# Patient Record
Sex: Female | Born: 1955 | Race: White | Hispanic: No | Marital: Married | Smoking: Former smoker
Health system: Southern US, Community
[De-identification: ages and names within clinical notes are randomized; demographics above are authoritative.]

## PROBLEM LIST (undated history)

## (undated) DIAGNOSIS — D869 Sarcoidosis, unspecified: Secondary | ICD-10-CM

## (undated) DIAGNOSIS — C4491 Basal cell carcinoma of skin, unspecified: Secondary | ICD-10-CM

## (undated) DIAGNOSIS — E079 Disorder of thyroid, unspecified: Secondary | ICD-10-CM

## (undated) DIAGNOSIS — K8020 Calculus of gallbladder without cholecystitis without obstruction: Secondary | ICD-10-CM

## (undated) DIAGNOSIS — R768 Other specified abnormal immunological findings in serum: Secondary | ICD-10-CM

## (undated) DIAGNOSIS — E785 Hyperlipidemia, unspecified: Secondary | ICD-10-CM

## (undated) DIAGNOSIS — I10 Essential (primary) hypertension: Secondary | ICD-10-CM

## (undated) DIAGNOSIS — B009 Herpesviral infection, unspecified: Secondary | ICD-10-CM

## (undated) DIAGNOSIS — F419 Anxiety disorder, unspecified: Secondary | ICD-10-CM

## (undated) DIAGNOSIS — N766 Ulceration of vulva: Secondary | ICD-10-CM

## (undated) DIAGNOSIS — F32A Depression, unspecified: Secondary | ICD-10-CM

## (undated) DIAGNOSIS — K219 Gastro-esophageal reflux disease without esophagitis: Secondary | ICD-10-CM

## (undated) DIAGNOSIS — S83206A Unspecified tear of unspecified meniscus, current injury, right knee, initial encounter: Secondary | ICD-10-CM

## (undated) DIAGNOSIS — Z780 Asymptomatic menopausal state: Secondary | ICD-10-CM

## (undated) DIAGNOSIS — D472 Monoclonal gammopathy: Secondary | ICD-10-CM

## (undated) DIAGNOSIS — M1990 Unspecified osteoarthritis, unspecified site: Secondary | ICD-10-CM

## (undated) DIAGNOSIS — F329 Major depressive disorder, single episode, unspecified: Secondary | ICD-10-CM

## (undated) HISTORY — DX: Gastro-esophageal reflux disease without esophagitis: K21.9

## (undated) HISTORY — DX: Asymptomatic menopausal state: Z78.0

## (undated) HISTORY — DX: Herpesviral infection, unspecified: B00.9

## (undated) HISTORY — DX: Unspecified osteoarthritis, unspecified site: M19.90

## (undated) HISTORY — DX: Ulceration of vulva: N76.6

## (undated) HISTORY — DX: Monoclonal gammopathy: D47.2

## (undated) HISTORY — DX: Essential (primary) hypertension: I10

## (undated) HISTORY — DX: Other specified abnormal immunological findings in serum: R76.8

## (undated) HISTORY — DX: Anxiety disorder, unspecified: F41.9

## (undated) HISTORY — DX: Depression, unspecified: F32.A

## (undated) HISTORY — DX: Disorder of thyroid, unspecified: E07.9

## (undated) HISTORY — DX: Hypercalcemia: E83.52

## (undated) HISTORY — DX: Unspecified tear of unspecified meniscus, current injury, right knee, initial encounter: S83.206A

## (undated) HISTORY — DX: Basal cell carcinoma of skin, unspecified: C44.91

## (undated) HISTORY — DX: Hyperlipidemia, unspecified: E78.5

## (undated) HISTORY — DX: Sarcoidosis, unspecified: D86.9

## (undated) HISTORY — DX: Major depressive disorder, single episode, unspecified: F32.9

## (undated) HISTORY — PX: BREAST EXCISIONAL BIOPSY: SUR124

## (undated) HISTORY — DX: Calculus of gallbladder without cholecystitis without obstruction: K80.20

---

## 2003-09-23 HISTORY — PX: BILATERAL SALPINGOOPHORECTOMY: SHX1223

## 2003-09-23 HISTORY — PX: ABDOMINAL HYSTERECTOMY: SHX81

## 2007-10-28 ENCOUNTER — Encounter: Payer: Self-pay | Admitting: Critical Care Medicine

## 2008-10-27 ENCOUNTER — Encounter: Payer: Self-pay | Admitting: Critical Care Medicine

## 2008-12-11 ENCOUNTER — Encounter: Payer: Self-pay | Admitting: Critical Care Medicine

## 2008-12-19 ENCOUNTER — Encounter: Payer: Self-pay | Admitting: Critical Care Medicine

## 2008-12-20 ENCOUNTER — Encounter: Payer: Self-pay | Admitting: Critical Care Medicine

## 2008-12-29 ENCOUNTER — Encounter: Payer: Self-pay | Admitting: Internal Medicine

## 2009-01-01 ENCOUNTER — Encounter: Payer: Self-pay | Admitting: Critical Care Medicine

## 2009-01-02 ENCOUNTER — Encounter: Payer: Self-pay | Admitting: Critical Care Medicine

## 2009-02-20 ENCOUNTER — Encounter: Payer: Self-pay | Admitting: Critical Care Medicine

## 2009-03-05 ENCOUNTER — Encounter: Payer: Self-pay | Admitting: Critical Care Medicine

## 2009-03-20 ENCOUNTER — Encounter: Payer: Self-pay | Admitting: Critical Care Medicine

## 2009-03-21 ENCOUNTER — Encounter: Payer: Self-pay | Admitting: Critical Care Medicine

## 2009-03-22 ENCOUNTER — Ambulatory Visit: Payer: Self-pay | Admitting: Critical Care Medicine

## 2009-03-22 DIAGNOSIS — I10 Essential (primary) hypertension: Secondary | ICD-10-CM

## 2009-03-22 DIAGNOSIS — D869 Sarcoidosis, unspecified: Secondary | ICD-10-CM

## 2009-04-13 ENCOUNTER — Telehealth: Payer: Self-pay | Admitting: Pulmonary Disease

## 2009-04-16 ENCOUNTER — Telehealth (INDEPENDENT_AMBULATORY_CARE_PROVIDER_SITE_OTHER): Payer: Self-pay | Admitting: *Deleted

## 2009-04-19 ENCOUNTER — Ambulatory Visit: Payer: Self-pay | Admitting: Diagnostic Radiology

## 2009-04-19 ENCOUNTER — Ambulatory Visit: Payer: Self-pay | Admitting: Critical Care Medicine

## 2009-04-19 ENCOUNTER — Ambulatory Visit (HOSPITAL_BASED_OUTPATIENT_CLINIC_OR_DEPARTMENT_OTHER): Admission: RE | Admit: 2009-04-19 | Discharge: 2009-04-19 | Payer: Self-pay | Admitting: Critical Care Medicine

## 2009-04-19 DIAGNOSIS — E2749 Other adrenocortical insufficiency: Secondary | ICD-10-CM | POA: Insufficient documentation

## 2009-04-25 LAB — CONVERTED CEMR LAB: Pap Smear: NORMAL

## 2009-05-10 ENCOUNTER — Ambulatory Visit: Payer: Self-pay | Admitting: Critical Care Medicine

## 2009-05-16 ENCOUNTER — Ambulatory Visit: Payer: Self-pay | Admitting: Internal Medicine

## 2009-05-16 DIAGNOSIS — R7309 Other abnormal glucose: Secondary | ICD-10-CM | POA: Insufficient documentation

## 2009-05-16 LAB — CONVERTED CEMR LAB
ALT: 42 units/L — ABNORMAL HIGH (ref 0–35)
Albumin: 4.6 g/dL (ref 3.5–5.2)
BUN: 19 mg/dL (ref 6–23)
Bilirubin, Direct: 0.1 mg/dL (ref 0.0–0.3)
CO2: 23 meq/L (ref 19–32)
Creatinine, Ser: 1.01 mg/dL (ref 0.40–1.20)
Eosinophils Absolute: 0.5 10*3/uL (ref 0.0–0.7)
HCT: 37.7 % (ref 36.0–46.0)
Hemoglobin: 13.4 g/dL (ref 12.0–15.0)
Hgb A1c MFr Bld: 5.7 % (ref 4.6–6.1)
Indirect Bilirubin: 0.6 mg/dL (ref 0.0–0.9)
Lymphs Abs: 1.5 10*3/uL (ref 0.7–4.0)
MCV: 87.7 fL (ref 78.0–100.0)
Monocytes Absolute: 1.1 10*3/uL — ABNORMAL HIGH (ref 0.1–1.0)
Monocytes Relative: 15 % — ABNORMAL HIGH (ref 3–12)
Neutro Abs: 4.5 10*3/uL (ref 1.7–7.7)
Potassium: 4.3 meq/L (ref 3.5–5.3)
RBC: 4.3 M/uL (ref 3.87–5.11)
Sodium: 139 meq/L (ref 135–145)
Total CHOL/HDL Ratio: 5.3
Total Protein: 6.9 g/dL (ref 6.0–8.3)
Triglycerides: 154 mg/dL — ABNORMAL HIGH (ref <150)
WBC: 7.7 10*3/uL (ref 4.0–10.5)

## 2009-05-22 ENCOUNTER — Encounter: Payer: Self-pay | Admitting: Internal Medicine

## 2009-08-09 ENCOUNTER — Ambulatory Visit: Payer: Self-pay | Admitting: Critical Care Medicine

## 2009-08-13 LAB — CONVERTED CEMR LAB: Angiotensin 1 Converting Enzyme: 75 units/L — ABNORMAL HIGH (ref 9–67)

## 2009-08-14 ENCOUNTER — Ambulatory Visit: Payer: Self-pay | Admitting: Internal Medicine

## 2009-08-14 DIAGNOSIS — R002 Palpitations: Secondary | ICD-10-CM | POA: Insufficient documentation

## 2009-09-05 ENCOUNTER — Encounter: Payer: Self-pay | Admitting: Internal Medicine

## 2009-10-19 ENCOUNTER — Ambulatory Visit: Payer: Self-pay | Admitting: Internal Medicine

## 2009-10-19 LAB — CONVERTED CEMR LAB
AST: 23 units/L (ref 0–37)
BUN: 18 mg/dL (ref 6–23)
Free T4: 1.19 ng/dL (ref 0.80–1.80)
Glucose, Bld: 97 mg/dL (ref 70–99)
HDL: 53 mg/dL (ref >39)
Potassium: 4.2 meq/L (ref 3.5–5.3)
TSH: 1.677 microintl units/mL (ref 0.350–4.500)
Total Bilirubin: 0.5 mg/dL (ref 0.3–1.2)
Total CHOL/HDL Ratio: 4.4
Triglycerides: 191 mg/dL — ABNORMAL HIGH (ref <150)

## 2009-10-25 ENCOUNTER — Ambulatory Visit: Payer: Self-pay | Admitting: Internal Medicine

## 2009-11-21 ENCOUNTER — Telehealth: Payer: Self-pay | Admitting: Internal Medicine

## 2009-12-14 ENCOUNTER — Ambulatory Visit: Payer: Self-pay | Admitting: Internal Medicine

## 2009-12-14 DIAGNOSIS — H6980 Other specified disorders of Eustachian tube, unspecified ear: Secondary | ICD-10-CM

## 2009-12-14 DIAGNOSIS — M79609 Pain in unspecified limb: Secondary | ICD-10-CM

## 2009-12-17 ENCOUNTER — Telehealth: Payer: Self-pay | Admitting: Internal Medicine

## 2009-12-18 ENCOUNTER — Encounter (INDEPENDENT_AMBULATORY_CARE_PROVIDER_SITE_OTHER): Payer: Self-pay | Admitting: *Deleted

## 2009-12-24 ENCOUNTER — Telehealth: Payer: Self-pay | Admitting: Family Medicine

## 2009-12-24 ENCOUNTER — Telehealth: Payer: Self-pay | Admitting: Internal Medicine

## 2009-12-24 ENCOUNTER — Ambulatory Visit: Payer: Self-pay | Admitting: Diagnostic Radiology

## 2009-12-24 ENCOUNTER — Emergency Department (HOSPITAL_BASED_OUTPATIENT_CLINIC_OR_DEPARTMENT_OTHER): Admission: EM | Admit: 2009-12-24 | Discharge: 2009-12-24 | Payer: Self-pay | Admitting: Emergency Medicine

## 2009-12-24 ENCOUNTER — Ambulatory Visit: Payer: Self-pay | Admitting: Internal Medicine

## 2009-12-24 DIAGNOSIS — H05019 Cellulitis of unspecified orbit: Secondary | ICD-10-CM | POA: Insufficient documentation

## 2009-12-26 ENCOUNTER — Ambulatory Visit: Payer: Self-pay | Admitting: Internal Medicine

## 2009-12-26 DIAGNOSIS — E041 Nontoxic single thyroid nodule: Secondary | ICD-10-CM

## 2010-01-02 ENCOUNTER — Ambulatory Visit: Payer: Self-pay | Admitting: Radiology

## 2010-01-02 ENCOUNTER — Ambulatory Visit (HOSPITAL_BASED_OUTPATIENT_CLINIC_OR_DEPARTMENT_OTHER): Admission: RE | Admit: 2010-01-02 | Discharge: 2010-01-02 | Payer: Self-pay | Admitting: Internal Medicine

## 2010-01-03 ENCOUNTER — Telehealth: Payer: Self-pay | Admitting: Internal Medicine

## 2010-01-03 DIAGNOSIS — E042 Nontoxic multinodular goiter: Secondary | ICD-10-CM

## 2010-01-22 ENCOUNTER — Encounter: Payer: Self-pay | Admitting: Internal Medicine

## 2010-01-22 ENCOUNTER — Encounter: Admission: RE | Admit: 2010-01-22 | Discharge: 2010-01-22 | Payer: Self-pay | Admitting: Internal Medicine

## 2010-01-22 ENCOUNTER — Other Ambulatory Visit: Admission: RE | Admit: 2010-01-22 | Discharge: 2010-01-22 | Payer: Self-pay | Admitting: Interventional Radiology

## 2010-01-23 ENCOUNTER — Telehealth: Payer: Self-pay | Admitting: Internal Medicine

## 2010-01-24 ENCOUNTER — Encounter (INDEPENDENT_AMBULATORY_CARE_PROVIDER_SITE_OTHER): Payer: Self-pay | Admitting: *Deleted

## 2010-01-24 ENCOUNTER — Ambulatory Visit: Payer: Self-pay | Admitting: Critical Care Medicine

## 2010-02-08 ENCOUNTER — Encounter: Payer: Self-pay | Admitting: Critical Care Medicine

## 2010-02-08 ENCOUNTER — Encounter: Admission: RE | Admit: 2010-02-08 | Discharge: 2010-02-08 | Payer: Self-pay | Admitting: Critical Care Medicine

## 2010-02-13 ENCOUNTER — Telehealth: Payer: Self-pay | Admitting: Critical Care Medicine

## 2010-03-05 ENCOUNTER — Encounter: Payer: Self-pay | Admitting: Internal Medicine

## 2010-03-20 ENCOUNTER — Ambulatory Visit: Payer: Self-pay | Admitting: Internal Medicine

## 2010-03-20 DIAGNOSIS — H9319 Tinnitus, unspecified ear: Secondary | ICD-10-CM | POA: Insufficient documentation

## 2010-04-02 ENCOUNTER — Encounter: Payer: Self-pay | Admitting: Internal Medicine

## 2010-04-22 HISTORY — PX: TOTAL THYROIDECTOMY: SHX2547

## 2010-04-24 ENCOUNTER — Encounter (INDEPENDENT_AMBULATORY_CARE_PROVIDER_SITE_OTHER): Payer: Self-pay | Admitting: General Surgery

## 2010-04-24 ENCOUNTER — Ambulatory Visit (HOSPITAL_COMMUNITY): Admission: RE | Admit: 2010-04-24 | Discharge: 2010-04-25 | Payer: Self-pay | Admitting: General Surgery

## 2010-04-24 ENCOUNTER — Encounter: Payer: Self-pay | Admitting: Internal Medicine

## 2010-04-29 ENCOUNTER — Encounter: Payer: Self-pay | Admitting: Internal Medicine

## 2010-05-20 ENCOUNTER — Encounter: Payer: Self-pay | Admitting: Internal Medicine

## 2010-05-23 ENCOUNTER — Ambulatory Visit: Payer: Self-pay | Admitting: Critical Care Medicine

## 2010-05-29 ENCOUNTER — Encounter: Payer: Self-pay | Admitting: Internal Medicine

## 2010-05-29 LAB — CONVERTED CEMR LAB: TSH: 2.225 microintl units/mL (ref 0.350–4.500)

## 2010-06-07 ENCOUNTER — Ambulatory Visit: Payer: Self-pay | Admitting: Internal Medicine

## 2010-06-07 DIAGNOSIS — E039 Hypothyroidism, unspecified: Secondary | ICD-10-CM | POA: Insufficient documentation

## 2010-06-10 ENCOUNTER — Telehealth: Payer: Self-pay | Admitting: Internal Medicine

## 2010-06-10 ENCOUNTER — Encounter: Payer: Self-pay | Admitting: Internal Medicine

## 2010-07-19 ENCOUNTER — Encounter: Payer: Self-pay | Admitting: Internal Medicine

## 2010-07-19 LAB — CONVERTED CEMR LAB
ALT: 26 units/L (ref 0–35)
AST: 24 units/L (ref 0–37)
Albumin: 4.4 g/dL (ref 3.5–5.2)
Alkaline Phosphatase: 79 units/L (ref 39–117)
CO2: 27 meq/L (ref 19–32)
Calcium: 9.7 mg/dL (ref 8.4–10.5)
Indirect Bilirubin: 0.3 mg/dL (ref 0.0–0.9)
LDL Cholesterol: 150 mg/dL — ABNORMAL HIGH (ref 0–99)
Potassium: 4.5 meq/L (ref 3.5–5.3)
T3, Free: 3.2 pg/mL (ref 2.3–4.2)
TSH: 0.235 microintl units/mL — ABNORMAL LOW (ref 0.350–4.500)
Total CHOL/HDL Ratio: 4.6
Total Protein: 6.6 g/dL (ref 6.0–8.3)

## 2010-07-22 ENCOUNTER — Encounter: Payer: Self-pay | Admitting: Internal Medicine

## 2010-07-22 LAB — CONVERTED CEMR LAB: CRP: 0.3 mg/dL (ref <0.6)

## 2010-07-26 ENCOUNTER — Telehealth: Payer: Self-pay | Admitting: Internal Medicine

## 2010-10-13 ENCOUNTER — Encounter: Payer: Self-pay | Admitting: Internal Medicine

## 2010-10-13 ENCOUNTER — Encounter: Payer: Self-pay | Admitting: Critical Care Medicine

## 2010-10-22 NOTE — Letter (Signed)
Summary: Primary Care Consult Scheduled Letter  [place] [facility]  [address]   [place], [place] [address]   Phone: [phone]  Fax: [phone]      12/18/2009 MRN: [id number]  Shannon Wright [address] [place], [place]  [address]    Dear Shannon Wright,      We have scheduled an appointment for you.  At the recommendation of DR YOO, we have scheduled you a consult with [facility],DR SYPHER  on _APRIL 4, 2011 at 10AM ARRRIVE 9;30AM .  Their address is_[address]  [place]   [address]. The office phone number is [phone].  If this appointment day and time is not convenient for you, please feel free to call the office of the doctor you are being referred to at the number listed above and reschedule the appointment.     It is important for you to keep your scheduled appointments. We are here to make sure you are given good patient care. If you have questions or you have made changes to your appointment, please notify us at  [phone], ask for HELEN.    Thank you, HELEN Patient Care Coordinator [place] [facility]

## 2010-10-22 NOTE — Assessment & Plan Note (Signed)
Summary: 6 month follow up/mhf rsc with pt from bump/mhf   Vital Signs:  Patient profile:   55 year old female Height:      70 inches Weight:      195.75 pounds BMI:     28.19 O2 Sat:      100 % on Room air Temp:     98.2 degrees F oral Pulse rate:   86 / minute Pulse rhythm:   regular Resp:     19 per minute BP sitting:   100 / 70  (left arm) Cuff size:   large  Vitals Entered By: Glendell Docker CMA (March 20, 2010 11:17 AM)  O2 Flow:  Room air CC: Rm 2- 6 Month Follow up  Comments unresolved ringing of of ear, scheduled for Thyroidectomy, meeting with Dr Bertram Savin on 7/12 for surgery consult, aggravated by bursitis in right shoulder   Primary Care Provider:  DThomos Lemons DO  CC:  Rm 2- 6 Month Follow up .  History of Present Illness: 55 yo white female for f.u pt referred to Dr. Horald Pollen re:  thyroid nodule she elected to proceed with thyroid surgery  pre op - no chest pain or shortness of breath.    Allergies (verified): No Known Drug Allergies  Past History:  Past Medical History: Hypertension Sarcoidosis   Right knee meniscal tear      Hx of positive ANA Hx of genital ulcers - question Bechets   Past Surgical History:  T A H and B S O          Family History: Mother head and neck cancer Mother had AVR (aortic valve replacement) Sister with tachycardia Family History Hypertension- Mother maternl grandfather deceased age 20 Lung Cancer Maternal grandmother deceased age 22 colon cancer       Stepdaughter has papillary thyroid cancer     Social History: occupation: Field seismologist for medical supplies [employer] Married 4 Years No children- 2 step dauhgters 20 & 22      Hobby - scuba diving      Physical Exam  General:  alert, well-developed, and well-nourished.   Neck:  No deformities, masses, or tenderness noted.no carotid bruits.   Lungs:  normal respiratory effort and normal breath sounds.   Heart:  normal rate, regular rhythm, and no  gallop.   Extremities:  No lower extremity edema    Impression & Recommendations:  Problem # 1:  GOITER, MULTINODULAR (ICD-241.1) proceed with thyroid surgery Orders: Surgical Referral (Surgery)  Problem # 2:  HYPERTENSION (ICD-401.9) Assessment: Unchanged well controlled.   no additional pre op cardiac testing recommended Her updated medication list for this problem includes:    Bisoprolol Fumarate 5 Mg Tabs (Bisoprolol fumarate) .Marland Kitchen... 1/2 by mouth once daily  BP today: 100/70 Prior BP: 134/84 (01/24/2010)  Labs Reviewed: K+: 4.2 (10/19/2009) Creat: : 0.93 (10/19/2009)   Chol: 235 (10/19/2009)   HDL: 53 (10/19/2009)   LDL: 144 (10/19/2009)   TG: 191 (10/19/2009)  Problem # 3:  TINNITUS (ICD-388.30) no improvement with intra nasal steroids.   declines ENT referral for now  Complete Medication List: 1)  Valacyclovir Hcl 1 Gm Tabs (Valacyclovir hcl) .... One by mouth three times a day 2)  Premarin 0.625 Mg Tabs (Estrogens conjugated) .... Take one tablet by mouth once daily 3)  Effexor Xr 150 Mg Xr24h-cap (Venlafaxine hcl) .... Take one tablet by mouth once daily 4)  Bisoprolol Fumarate 5 Mg Tabs (Bisoprolol fumarate) .... 1/2 by mouth once  daily 5)  Fluticasone Propionate 50 Mcg/act Susp (Fluticasone propionate) .... 2 sprays each nostril once daily  Patient Instructions: 1)  Please schedule a follow-up appointment in 3 months.  Current Allergies (reviewed today): No known allergies

## 2010-10-22 NOTE — Progress Notes (Signed)
Summary: refill--bisoprolol  Phone Note Refill Request Message from:  Fax from [facility] on July 26, 2010 11:37 AM  Refills Requested: Medication #1:  BISOPROLOL FUMARATE 5 MG TABS 1/2 by mouth once daily   Dosage confirmed as above?Dosage Confirmed   Supply Requested: 3 months   Last Refilled: 07/07/2010 Next Appointment Scheduled: 12-04-09 Dr Artist Pais Initial call taken by: Mervin Kung CMA Duncan Dull),  July 26, 2010 11:38 AM    Prescriptions: BISOPROLOL FUMARATE 5 MG TABS (BISOPROLOL FUMARATE) 1/2 by mouth once daily  #45 x 1   Entered by:   Mervin Kung CMA (AAMA)   Authorized by:   D. Thomos Lemons DO   Signed by:   Mervin Kung CMA (AAMA) on 07/26/2010   Method used:   Electronically to        [facility] [address] (mail-order)       [facility]       [address]       [place], [place]  [address]       Ph: [phone]       Fax: [phone]   RxID:   [id number]

## 2010-10-22 NOTE — Progress Notes (Signed)
Summary: results of biopsy   Phone Note Call from Patient   Caller: patient  Call For: Legend Tumminello  Summary of Call: patient would like the results of her thyroid biopsy [phone] Initial call taken by: Roselle Locus,  Jan 23, 2010 2:16 PM  Follow-up for Phone Call        Spoke to pt this morning and let her know that we received thyroid biopsy results and Dr Artist Pais will call her to discuss results.  Mervin Kung CMA  Jan 24, 2010 8:33 AM   Additional Follow-up for Phone Call Additional follow up Details #1::        I discussed results with pt cytopath showed follicular lesion hyperplastic nodule vs adenomatous lesion refer to endo for surveillance Additional Follow-up by: D. Thomos Lemons DO,  Jan 24, 2010 10:30 AM  New Problems: THYROID NODULE, LEFT (ICD-241.0)   Additional Follow-up for Phone Call Additional follow up Details #2::    Appt   Dr  Talmage Nap   June   14th  Follow-up by: Darral Dash,  Jan 24, 2010 2:22 PM  New Problems: THYROID NODULE, LEFT (ICD-241.0)

## 2010-10-22 NOTE — Consult Note (Signed)
Summary: [facility]   Imported By: Lanelle Bal 04/09/2010 10:04:30  _____________________________________________________________________  External Attachment:    Type:   Image     Comment:   External Document

## 2010-10-22 NOTE — Assessment & Plan Note (Signed)
Summary: Pulmonary OV   Copy to:  Dr. Jannifer Franklin Primary Provider/Referring Provider:  Dondra Spry DO  CC:  Follow up.  Breathing doing well overall.  Nonprod cough at times.  Denies SOB, wheezing, and chest tightness.  Would like to discuss CT Chest results from last OV.  Marland Kitchen  History of Present Illness: Pulmonary OV   This is a 55 year old female  with sarcoidosis  Stage III  03/22/09: Pt symptoms begain 2/10.  Pt was noting dyspnea [place] running through airports.  Had routine Px and abn CXR seen.  Subsequently had CT Chest with bilat upper lobe perivascular nodular infiltrates. Pt referred to [facility] and saw E [place].  Had TBBx FOB with granulomas seen on bx. Pt rx pred 40mg  /d and due to delay in getting back to pulmonary had slow pred taper.  Pt was reduced end of 5/10 to 30mg /d and held there.  lots of side effects with pred including increase weight gain, bloating , HR increase.   At out set pt was asymptomatic except with exertion on extreme.  Pt had pfts ?results.   In 5/10 had chst pain in airport in [place] and taken off airplane and w/u neg for cardiac dz. Pt desires second opinion to determine treatment plan for sarcoid.  Currently is not having cough or dyspnea.  No chest pain.  Pt does note heartburn and sore throat.  April 19, 2009 12:21 PM Has weaned off prednisone then became weak with fatigue, night sweats, headaches, listless, temp low grade.  Pt is flushed. Cannot handle the extreme heat. Nots sl non prod cough.  Pndrip is noted.  May 10, 2009 11:29 AM The pt is slowly better. There is less weakness.  The pt has been off pred since 7/20.  Cortisol was low around 7.0.  No more fever.  The pt still has some sweats.  No real cough.  No energy.  Pt is off HCTZ.   There are not any alleviating or precipitating factors noted.  The symptoms do not generally fluctuate. Pt denies any significant sore throat, nasal congestion or excess secretions, fever, chills,  sweats, unintended weight loss, pleurtic or exertional chest pain, orthopnea PND, or leg swelling Pt denies any increase in rescue therapy over baseline, denies waking up needing it or having any early am or nocturnal exacerbations of coughing/wheezing/or dyspnea.   August 09, 2009 11:02 AM ? of mold issue in [place] home wiht black mold occ has chest heaviness and still with cough and clears throat.  Non prod cough Off pred since 7/20.  Still no energy.  Saw Dr Artist Pais in 8/10 and stopped metoprolol ? steroid issue off all HTN meds for one month occ has tachycardia and pulse is erratic , notes palpitations did wean the metoprolol slowly 160/98 has been dx with HTN in the past . prior to sarcoid isse Jan 24, 2010 11:29 AM No change in lungs.  Bx on thyroid  for a nodule No real cough,  except occ raspy cough,  Not dyspneic with exertion. father has pulmonary fibrosis.   May 23, 2010 12:41 PM no difficulty with breathing or cough.  no new issues. had CT chest 5/11 and did this did not show any changes Pt denies any significant sore throat, nasal congestion or excess secretions, fever, chills, sweats, unintended weight loss, pleurtic or exertional chest pain, orthopnea PND, or leg swelling Pt denies any increase in rescue therapy over baseline, denies waking up needing it  or having any early am or nocturnal exacerbations of coughing/wheezing/or dyspnea.   Preventive Screening-Counseling & Management  Alcohol-Tobacco     Alcohol drinks/day: 1 per day     Alcohol type: all     Alcohol Counseling: to decrease amount and/or frequency of alcohol intake     Smoking Status: never     Packs/Day: 0.5     Year Started: 1992     Year Quit: 2006     Pack years: 8     Tobacco Counseling: not to resume use of tobacco products  Clinical Reports Reviewed:  PFT's:  08/09/2009: FEF 25/75 %Predicted:  54 FEV1 %Predicted:  90 FEV1/FVC %Predicted:  92 FVC %Predicted:  100  CT of  Chest:  02/07/2010: CT of Chest:  Findings: No prior CT of the chest is available for comparison.  On the present study  multiple small nodules are present which are primarily perilymphatic in distribution, with upper lobe predominance.  There is some nodularity of the fissures, and there is interlobular septal thickening present.  Slightly more prominent nodules also are present scattered throughout the lungs with a nodule adjacent to the right heart border measuring 7 mm in diameter.  These findings are very typical of sarcoidosis in the early and/or active phase.  No bronchiectasis is seen and no honeycombing is noted.  If there is a prior CT the chest , direct comparison is recommended to assess interval change.  No pleural effusion is seen.   On soft tissue window images there may be small low attenuation thyroid nodules present.  No definite mediastinal or hilar adenopathy is seen, with a calcified left hilar node present.  What is seen of the upper abdomen is unremarkable.  No bony abnormality is noted.   IMPRESSION: Multiple small perilymphatic nodules scattered throughout primarily the upper lobes with some nodularity of the fissures and interlobular septal thickening consistent with changes of sarcoidosis.  No adenopathy is seen.    12/19/2008: CT of Chest:  Diffuse peribronchovascular and subpleural micronodularity in the upper lobes, No pleural effusions or mediastinal LAN.  innummerable splenic nodules     Current Medications (verified): 1)  Valacyclovir Hcl 1 Gm Tabs (Valacyclovir Hcl) .... One By Mouth Three Times A Day 2)  Premarin 0.625 Mg Tabs (Estrogens Conjugated) .... Take One Tablet By Mouth Once Daily 3)  Effexor Xr 150 Mg Xr24h-Cap (Venlafaxine Hcl) .... Take One Tablet By Mouth Once Daily 4)  Bisoprolol Fumarate 5 Mg Tabs (Bisoprolol Fumarate) .... 1/2 By Mouth Once Daily 5)  Synthroid 112 Mcg Tabs (Levothyroxine Sodium) .... Take 1 Tablet By Mouth  Once A Day  Allergies (verified): No Known Drug Allergies  Past History:  Past medical, surgical, family and social histories (including risk factors) reviewed, and no changes noted (except as noted below).  Past Medical History: Reviewed history from 03/20/2010 and no changes required. Hypertension Sarcoidosis   Right knee meniscal tear      Hx of positive ANA Hx of genital ulcers - question Bechets   Past Surgical History: Reviewed history from 03/20/2010 and no changes required.  T A H and B S O          Family History: Reviewed history from 03/20/2010 and no changes required. Mother head and neck cancer Mother had AVR (aortic valve replacement) Sister with tachycardia Family History Hypertension- Mother maternl grandfather deceased age 12 Lung Cancer Maternal grandmother deceased age 71 colon cancer       Stepdaughter has papillary thyroid cancer  Social History: Reviewed history from 03/20/2010 and no changes required. 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  Quit in 2006.  < 1/2 ppd x 10 yrs.     Vital Signs:  Patient profile:   54 year old female Height:      70 inches Weight:      195 pounds BMI:     28.08 O2 Sat:      98 % on Room air Temp:     98.1 degrees F oral Pulse rate:   62 / minute BP sitting:   116 / 70  (left arm) Cuff size:   regular  Vitals Entered By: Gweneth Dimitri RN (May 23, 2010 12:21 PM)  O2 Flow:  Room air CC: Follow up.  Breathing doing well overall.  Nonprod cough at times.  Denies SOB, wheezing, chest tightness.  Would like to discuss CT Chest results from last OV.   Comments Medications reviewed with patient Daytime contact number verified with patient. Gweneth Dimitri RN  May 23, 2010 12:21 PM    Physical Exam  Additional Exam:  Gen: Pleasant, well-nourished, mildly obese , in no distress,   normal affect; cushingoid facies ENT: No lesions,  mouth clear,  oropharynx clear, no postnasal drip Neck: No JVD, no TMG, no carotid bruits Lungs: No use of accessory muscles, no dullness to percussion, clear without rales or rhonchi Cardiovascular: RRR, heart sounds normal, no murmur or gallops, no peripheral edema Abdomen: soft and NT, no HSM,  BS normal Musculoskeletal: No deformities, no cyanosis or clubbing Neuro: alert, non focal Skin: Warm, no lesions or rashes Lymph:  no peripheral lymphadenopathy   Impression & Recommendations:  Problem # 1:  SARCOIDOSIS (ICD-135) Assessment Unchanged  stable sarcoidosis. plan no prednisone for now monitor  Medications Added to Medication List This Visit: 1)  Synthroid 112 Mcg Tabs (Levothyroxine sodium) .... Take 1 tablet by mouth once a day  Complete Medication List: 1)  Valacyclovir Hcl 1 Gm Tabs (Valacyclovir hcl) .... One by mouth three times a day 2)  Premarin 0.625 Mg Tabs (Estrogens conjugated) .... Take one tablet by mouth once daily 3)  Effexor Xr 150 Mg Xr24h-cap (Venlafaxine hcl) .... Take one tablet by mouth once daily 4)  Bisoprolol Fumarate 5 Mg Tabs (Bisoprolol fumarate) .... 1/2 by mouth once daily 5)  Synthroid 112 Mcg Tabs (Levothyroxine sodium) .... Take 1 tablet by mouth once a day  Other Orders: Est. Patient Level III (11914)  Patient Instructions: 1)  No change in medications 2)  Return in     6     months

## 2010-10-22 NOTE — Progress Notes (Signed)
  Phone Note Other Incoming   Caller: [facility] Summary of Call: Called to let us know that CT scan done, consistent with orbital cellulitis.  They have given her IV abx.  They will send CT report to Dr. Artist Pais. Initial call taken by: Ruthe Mannan MD,  December 24, 2009 5:54 PM

## 2010-10-22 NOTE — Miscellaneous (Signed)
Summary: Orders Update  Clinical Lists Changes  Orders: Added new Test order of T-BUN ([id number]) - Signed Added new Test order of T-Creatine ([id number]) - Signed 

## 2010-10-22 NOTE — Miscellaneous (Signed)
Summary: CT Chest   Clinical Lists Changes  Observations: Added new observation of CT OF CHEST: Findings: No prior CT of the chest is available for comparison.  On the present study  multiple small nodules are present which are primarily perilymphatic in distribution, with upper lobe predominance.  There is some nodularity of the fissures, and there is interlobular septal thickening present.  Slightly more prominent nodules also are present scattered throughout the lungs with a nodule adjacent to the right heart border measuring 7 mm in diameter.  These findings are very typical of sarcoidosis in the early and/or active phase.  No bronchiectasis is seen and no honeycombing is noted.  If there is a prior CT the chest , direct comparison is recommended to assess interval change.  No pleural effusion is seen.   On soft tissue window images there may be small low attenuation thyroid nodules present.  No definite mediastinal or hilar adenopathy is seen, with a calcified left hilar node present.  What is seen of the upper abdomen is unremarkable.  No bony abnormality is noted.   IMPRESSION: Multiple small perilymphatic nodules scattered throughout primarily the upper lobes with some nodularity of the fissures and interlobular septal thickening consistent with changes of sarcoidosis.  No adenopathy is seen.   (02/07/2010 9:36)      CT of Chest  Procedure date:  02/07/2010  Findings:      Findings: No prior CT of the chest is available for comparison.  On the present study  multiple small nodules are present which are primarily perilymphatic in distribution, with upper lobe predominance.  There is some nodularity of the fissures, and there is interlobular septal thickening present.  Slightly more prominent nodules also are present scattered throughout the lungs with a nodule adjacent to the right heart border measuring 7 mm in diameter.  These findings are very typical of  sarcoidosis in the early and/or active phase.  No bronchiectasis is seen and no honeycombing is noted.  If there is a prior CT the chest , direct comparison is recommended to assess interval change.  No pleural effusion is seen.   On soft tissue window images there may be small low attenuation thyroid nodules present.  No definite mediastinal or hilar adenopathy is seen, with a calcified left hilar node present.  What is seen of the upper abdomen is unremarkable.  No bony abnormality is noted.   IMPRESSION: Multiple small perilymphatic nodules scattered throughout primarily the upper lobes with some nodularity of the fissures and interlobular septal thickening consistent with changes of sarcoidosis.  No adenopathy is seen.

## 2010-10-22 NOTE — Assessment & Plan Note (Signed)
Summary: Pulmonary OV   Copy to:  Dr. Jannifer Franklin Primary Provider/Referring Provider:  Dondra Spry DO  CC:  6 month follow up.  Pt states no changes in breathing.  States she does have occ wheezing and occ dry cough but states this is no better or worse.  Marland Kitchen  History of Present Illness: Pulmonary OV   This is a 55 year old female  with sarcoidosis  Stage III  03/22/09: Pt symptoms begain 2/10.  Pt was noting dyspnea with running through airports.  Had routine Px and abn CXR seen.  Subsequently had CT Chest with bilat upper lobe perivascular nodular infiltrates. Pt referred to [facility] and saw E [place].  Had TBBx FOB with granulomas seen on bx. Pt rx pred 40mg  /d and due to delay in getting back to pulmonary had slow pred taper.  Pt was reduced end of 5/10 to 30mg /d and held there.  lots of side effects with pred including increase weight gain, bloating , HR increase.   At out set pt was asymptomatic except with exertion on extreme.  Pt had pfts ?results.   In 5/10 had chst pain in airport in [place] and taken off airplane and w/u neg for cardiac dz. Pt desires second opinion to determine treatment plan for sarcoid.  Currently is not having cough or dyspnea.  No chest pain.  Pt does note heartburn and sore throat.  April 19, 2009 12:21 PM Has weaned off prednisone then became weak with fatigue, night sweats, headaches, listless, temp low grade.  Pt is flushed. Cannot handle the extreme heat. Nots sl non prod cough.  Pndrip is noted.  May 10, 2009 11:29 AM The pt is slowly better. There is less weakness.  The pt has been off pred since 7/20.  Cortisol was low around 7.0.  No more fever.  The pt still has some sweats.  No real cough.  No energy.  Pt is off HCTZ.   There are not any alleviating or precipitating factors noted.  The symptoms do not generally fluctuate. Pt denies any significant sore throat, nasal congestion or excess secretions, fever, chills, sweats, unintended  weight loss, pleurtic or exertional chest pain, orthopnea PND, or leg swelling Pt denies any increase in rescue therapy over baseline, denies waking up needing it or having any early am or nocturnal exacerbations of coughing/wheezing/or dyspnea.   August 09, 2009 11:02 AM ? of mold issue in [place] home wiht black mold occ has chest heaviness and still with cough and clears throat.  Non prod cough Off pred since 7/20.  Still no energy.  Saw Dr Artist Pais in 8/10 and stopped metoprolol ? steroid issue off all HTN meds for one month occ has tachycardia and pulse is erratic , notes palpitations did wean the metoprolol slowly 160/98 has been dx with HTN in the past . prior to sarcoid isse Jan 24, 2010 11:29 AM No change in lungs.  Bx on thyroid  for a nodule No real cough,  except occ raspy cough,  Not dyspneic with exertion. father has pulmonary fibrosis.   Preventive Screening-Counseling & Management  Alcohol-Tobacco     Smoking Status: never  Current Medications (verified): 1)  Valacyclovir Hcl 1 Gm Tabs (Valacyclovir Hcl) .... One By Mouth Three Times A Day 2)  Premarin 0.625 Mg Tabs (Estrogens Conjugated) .... Take One Tablet By Mouth Once Daily 3)  Effexor Xr 150 Mg Xr24h-Cap (Venlafaxine Hcl) .... Take One Tablet By Mouth Once Daily 4)  Bisoprolol Fumarate 5 Mg Tabs (Bisoprolol Fumarate) .... 1/2 By Mouth Once Daily 5)  Fluticasone Propionate 50 Mcg/act Susp (Fluticasone Propionate) .... 2 Sprays Each Nostril Once Daily  Allergies (verified): No Known Drug Allergies  Past History:  Past medical, surgical, family and social histories (including risk factors) reviewed, and no changes noted (except as noted below).  Past Medical History: Reviewed history from 12/26/2009 and no changes required. Hypertension Sarcoidosis   Right knee meniscal tear    Hx of positive ANA Hx of genital ulcers - question Bechets   Past Surgical History: Reviewed history from 12/26/2009 and no  changes required.  T A H and B S O         Family History: Reviewed history from 12/26/2009 and no changes required. Mother head and neck cancer Mother had AVR (aortic valve replacement) Sister with tachycardia Family History Hypertension- Mother Nelva Nay grandfather deceased age 62 Lung Cancer Maternal grandmother deceased age 79 colon cancer       Stepdaughter has papillary thyroid cancer    Social History: Reviewed history from 12/26/2009 and no changes required. occupation: Field seismologist for medical supplies [employer] Married 4 Years No children- 2 step dauhgters 20 & 42      Hobby - scuba diving     Review of Systems  The patient denies shortness of breath with activity, shortness of breath at rest, productive cough, non-productive cough, coughing up blood, chest pain, irregular heartbeats, acid heartburn, indigestion, loss of appetite, weight change, abdominal pain, difficulty swallowing, sore throat, tooth/dental problems, headaches, nasal congestion/difficulty breathing through nose, sneezing, itching, ear ache, anxiety, depression, hand/feet swelling, joint stiffness or pain, rash, change in color of mucus, and fever.    Vital Signs:  Patient profile:   55 year old female Height:      70 inches Weight:      204 pounds BMI:     29.38 O2 Sat:      99 % on Room air Temp:     98.4 degrees F oral Pulse rate:   70 / minute BP sitting:   134 / 84  (left arm) Cuff size:   regular  Vitals Entered By: Gweneth Dimitri RN (Jan 24, 2010 11:26 AM)  O2 Flow:  Room air CC: 6 month follow up.  Pt states no changes in breathing.  States she does have occ wheezing and occ dry cough but states this is no better or worse.   Comments Medications reviewed with patient Daytime contact number verified with patient. Gweneth Dimitri RN  Jan 24, 2010 11:26 AM    Physical Exam  Additional Exam:  Gen: Pleasant, well-nourished, mildly obese , in no distress,  normal affect; cushingoid  facies ENT: No lesions,  mouth clear,  oropharynx clear, no postnasal drip Neck: No JVD, no TMG, no carotid bruits Lungs: No use of accessory muscles, no dullness to percussion, clear without rales or rhonchi Cardiovascular: RRR, heart sounds normal, no murmur or gallops, no peripheral edema Abdomen: soft and NT, no HSM,  BS normal Musculoskeletal: No deformities, no cyanosis or clubbing Neuro: alert, non focal Skin: Warm, no lesions or rashes Lymph:  no peripheral lymphadenopathy   Impression & Recommendations:  Problem # 1:  SARCOIDOSIS (ICD-135) Assessment Unchanged stable sarcoidosis. paln  repeat CT chest  no prednisone for now  Complete Medication List: 1)  Valacyclovir Hcl 1 Gm Tabs (Valacyclovir hcl) .... One by mouth three times a day 2)  Premarin 0.625 Mg Tabs (Estrogens conjugated) .Marland KitchenMarland KitchenMarland Kitchen  Take one tablet by mouth once daily 3)  Effexor Xr 150 Mg Xr24h-cap (Venlafaxine hcl) .... Take one tablet by mouth once daily 4)  Bisoprolol Fumarate 5 Mg Tabs (Bisoprolol fumarate) .... 1/2 by mouth once daily 5)  Fluticasone Propionate 50 Mcg/act Susp (Fluticasone propionate) .... 2 sprays each nostril once daily  Other Orders: Est. Patient Level III ([address]109) Radiology Referral (Radiology)  Patient Instructions: 1)  CT Chest today , I will call with results 2)  No change in medications 3)  Return 6 months [place]  Appended Document: Pulmonary OV fax steve hendrickson

## 2010-10-22 NOTE — Progress Notes (Signed)
Summary: Ear Irritation  ---- Converted from flag ---- ---- 12/14/2009 5:26 PM, D. Thomos Lemons DO wrote: call pt - she can use small amt of hydrocortisone (OTC) on q tip for left ear itching ------------------------------  Phone Note Outgoing Call   Call placed by: Glendell Docker CMA,  December 17, 2009 10:08 AM Call placed to: Patient Summary of Call: patient advised per Dr Artist Pais instructions Initial call taken by: Glendell Docker CMA,  December 17, 2009 10:09 AM

## 2010-10-22 NOTE — Progress Notes (Signed)
Summary: results  Phone Note Call from Patient Call back at [phone]   Caller: Patient Call For: Haden Suder Summary of Call: calling for cat scan results Initial call taken by: Rickard Patience,  Feb 13, 2010 4:20 PM  Follow-up for Phone Call        please advise of CT results. Carron Curie CMA  Feb 13, 2010 5:24 PM   Additional Follow-up for Phone Call Additional follow up Details #1::        I tried to call.  no answer I left msg on her cell that CT chest is stable and no need for further treatment of sarcoid  Additional Follow-up by: Storm Frisk MD,  Feb 14, 2010 9:21 AM

## 2010-10-22 NOTE — Letter (Signed)
Summary: [facility]   Imported By: Lanelle Bal 06/18/2010 09:12:45  _____________________________________________________________________  External Attachment:    Type:   Image     Comment:   External Document

## 2010-10-22 NOTE — Consult Note (Signed)
Summary: [facility]   Imported By: Lanelle Bal 01/03/2010 14:11:51  _____________________________________________________________________  External Attachment:    Type:   Image     Comment:   External Document

## 2010-10-22 NOTE — Letter (Signed)
Summary: Biometric Screening Results Form/[facility]  Biometric Screening Results Form/[facility]   Imported By: Lanelle Bal 06/18/2010 10:30:18  _____________________________________________________________________  External Attachment:    Type:   Image     Comment:   External Document

## 2010-10-22 NOTE — Assessment & Plan Note (Signed)
Summary: 2 day follow up/mhf   Vital Signs:  Patient profile:   55 year old female Weight:      203 pounds BMI:     29.23 O2 Sat:      100 % on Room air Temp:     97.6 degrees F oral Pulse rate:   75 / minute Pulse rhythm:   irregular Resp:     18 per minute BP sitting:   114 / 80  (right arm) Cuff size:   large  Vitals Entered By: Glendell Docker CMA (December 26, 2009 9:54 AM)  O2 Flow:  Room air CC: Rm 3- 2 day follow up  Comments feel better, stil some lethargy present   Primary Care Provider:  DThomos Lemons DO  CC:  Rm 3- 2 day follow up .  History of Present Illness: 55 y/o white female for follow up re:  periorbital cellulitis CT of face and neck reviewed.  Left perorbital cellulitis.  no orbital abscess  1. No abscess or pathologic adenopathy in the neck identified. 2. Chronic appearing upper lobe findings suspicious for sarcoidosis. There is mild prevascular adenopathy also likely due to sarcoidosis. 3. Small right thyroid nodule, likely benign.  pt seen in ER.  she was given IV abx  she has been taking augmentin as directed. left periorbital edema and redness getting much better     Allergies (verified): No Known Drug Allergies  Past History:  Past Medical History: Hypertension Sarcoidosis   Right knee meniscal tear    Hx of positive ANA Hx of genital ulcers - question Bechets   Past Surgical History:  T A H and B S O         Family History: Mother head and neck cancer Mother had AVR (aortic valve replacement) Sister with tachycardia Family History Hypertension- Mother maternl grandfather deceased age 80 Lung Cancer Maternal grandmother deceased age 31 colon cancer       Stepdaughter has papillary thyroid cancer    Social History: 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   Head:  normocephalic and atraumatic.   Eyes:  pupils equal, pupils round, and pupils reactive to light.  EOMI mild left eye swelling and redness no facial rash Lungs:  normal respiratory effort and normal breath sounds.   Heart:  normal rate, regular rhythm, and no gallop.   Extremities:  No lower extremity edema    Impression & Recommendations:  Problem # 1:  PERIORBITAL CELLULITIS (ICD-376.01) Assessment Improved left periorbital cellulitis improved.  Pt reports hx of severe infections in the past (Legionella pna).   consider immune disorder.   refer to immunologist for further testing.  Herpes zoster less likely.  DC valcyclovir  Orders: Misc. Referral (Misc. Ref)  Problem # 2:  THYROID NODULE, RIGHT (ICD-241.0) CT scan of neck showed small right thyoid nodule.  arrange dedicated study prev TFTs normal  Orders: Radiology Referral (Radiology)  Complete Medication List: 1)  Valacyclovir Hcl 1 Gm Tabs (Valacyclovir hcl) .... One by mouth three times a day 2)  Premarin 0.625 Mg Tabs (Estrogens conjugated) .... Take one tablet by mouth once daily 3)  Effexor Xr 150 Mg Xr24h-cap (Venlafaxine hcl) .... Take one tablet by mouth once daily 4)  Bisoprolol Fumarate 5 Mg Tabs (Bisoprolol fumarate) .Marland KitchenMarland KitchenMarland Kitchen  1/2 by mouth once daily 5)  Fluticasone Propionate 50 Mcg/act Susp (Fluticasone propionate) .... 2 sprays each nostril once daily 6)  Amoxicillin-pot Clavulanate 875-125 Mg Tabs (Amoxicillin-pot clavulanate) .... One by mouth two times a day   Current Allergies (reviewed today): No known allergies

## 2010-10-22 NOTE — Miscellaneous (Signed)
Summary: lab order  Clinical Lists Changes  Orders: Added new Test order of T-T3, Free [phone]) - Signed  Appended Document: lab order    Clinical Lists Changes  Orders: Added new Test order of T-T4, Free [phone]) - Signed Added new Test order of T-TSH [phone]) - Signed

## 2010-10-22 NOTE — Assessment & Plan Note (Signed)
Summary: SWOLLEN EYE LID/DK 11:15 per darlene   Vital Signs:  Patient profile:   55 year old female O2 Sat:      100 % on Room air Temp:     97.9 degrees F oral Pulse rate:   72 / minute Pulse rhythm:   regular Resp:     18 per minute BP sitting:   100 / 70  (left arm) Cuff size:   large  Vitals Entered By: Glendell Docker CMA (December 24, 2009 11:12 AM)  O2 Flow:  Room air CC: Rm 3- Left eye swelling Comments left eye /facial swelling onset Friday   Primary Care Provider:  Dondra Spry DO  CC:  Rm 3- Left eye swelling.  History of Present Illness: 55 y/o white female returned from business trip from [place] last week. on Friday left eye lid felt tender she woke up on Saturday and left eye swelling got much worse,  closed shut with swelling and redness swelling worse in AM and gradually improves she also noted left ear pruritus and mild tenderness of lymph node anterior to ear  no vesicular rash mild headache no fever  Allergies (verified): No Known Drug Allergies  Past History:  Past Medical History: Hypertension Sarcoidosis  Right knee meniscal tear    Hx of positive ANA Hx of genital ulcers - question Bechets   Past Surgical History:  T A H and B S O        Family History: Mother head and neck cancer Mother had AVR (aortic valve replacement) Sister with tachycardia Family History Hypertension- Mother maternl grandfather deceased age 43 Lung Cancer Maternal grandmother deceased age 33 colon cancer       Stepdaughter has papillary thyroid cancer   Social History: 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    Physical Exam  General:  alert, well-developed, and well-nourished.   Head:  normocephalic and atraumatic.   Eyes:  pupils equal, pupils round, and pupils  reactive to light.  EOMI left eye swelling and redness no facial rash Ears:  R ear normal and L ear normal.   left ear canal normal Lungs:  normal respiratory effort and normal breath sounds.   Heart:  normal rate, regular rhythm, and no gallop.   Skin:  no suspicious lesions.   Psych:  normally interactive, good eye contact, not anxious appearing, and not depressed appearing.     Impression & Recommendations:  Problem # 1:  PERIORBITAL CELLULITIS (ICD-376.01) 54 y/o with redness and edema around left eye.  no change in vision or eye pain.  I suspect preseptal cellulitis.  she has some left ear discomfort.  Herpes zoster is also consideration.  empiric valacylovir. pt given cetriaxone 500 mg IM x 1.  start augmentin. close follow up.  Patient advised to call office if symptoms persist or worsen.  Orders: CT without Contrast (CT w/o contrast) Admin of Therapeutic Inj  intramuscular or subcutaneous ([address]) Rocephin  250mg  (U0454)  Complete Medication List: 1)  Valacyclovir Hcl 1 Gm Tabs (Valacyclovir hcl) .... One by mouth three times a day 2)  Premarin 0.625 Mg Tabs (Estrogens conjugated) .... Take one tablet by mouth once daily 3)  Effexor Xr 150 Mg Xr24h-cap (Venlafaxine hcl) .... Take one tablet by mouth once daily  4)  Bisoprolol Fumarate 5 Mg Tabs (Bisoprolol fumarate) .... 1/2 by mouth once daily 5)  Fluticasone Propionate 50 Mcg/act Susp (Fluticasone propionate) .... 2 sprays each nostril once daily 6)  Amoxicillin-pot Clavulanate 875-125 Mg Tabs (Amoxicillin-pot clavulanate) .... One by mouth two times a day  Patient Instructions: 1)  Call our office if your symptoms do not  improve or gets worse. 2)  Please schedule a follow-up appointment in 2 days. Prescriptions: VALACYCLOVIR HCL 1 GM TABS (VALACYCLOVIR HCL) one by mouth three times a day  #21 x 0   Entered and Authorized by:   D. Thomos Lemons DO   Signed by:   D. Thomos Lemons DO on 12/24/2009   Method used:    Electronically to        [facility] # [id number]* (retail)       [address] [place], [place]  [address]       Ph: [phone]       Fax: [phone]   RxID:   [id number] AMOXICILLIN-POT CLAVULANATE 875-125 MG TABS (AMOXICILLIN-POT CLAVULANATE) one by mouth two times a day  #20 x 0   Entered and Authorized by:   D. Thomos Lemons DO   Signed by:   D. Thomos Lemons DO on 12/24/2009   Method used:   Electronically to        [facility] # [id number]* (retail)       [address] [place], [place]  [address]       Ph: [phone]       Fax: [phone]   RxID:   [id number]     Current Allergies (reviewed today): No known allergies   Medication Administration  Injection # 1:    Medication: Rocephin  250mg     Diagnosis: PERIORBITAL CELLULITIS (ICD-376.01)    Route: IM    Site: LUOQ gluteus    Exp Date: 11/20/2011    Lot #: [id number]    Mfr: NOVAPLUS    Comments: 500 mg given IM    Patient tolerated injection without complications    Given by: Glendell Docker CMA (December 24, 2009 12:01 PM)  Orders Added: 1)  CT without Contrast [CT w/o contrast] 2)  Admin of Therapeutic Inj  intramuscular or subcutaneous [96372] 3)  Rocephin  250mg  [J0696] 4)  Est. Patient Level IV [32355]

## 2010-10-22 NOTE — Progress Notes (Signed)
Summary: Change in Contrast  Phone Note Other Incoming   Summary of Call: received call from radiology re: change in CT order to include contrast Initial call taken by: D. Thomos Lemons DO,  December 24, 2009 1:22 PM  Follow-up for Phone Call        Radiology has been informed per Nelda Marseille. Follow-up by: Glendell Docker CMA,  December 24, 2009 1:38 PM

## 2010-10-22 NOTE — Assessment & Plan Note (Signed)
Summary: 3 month follow up/mhf-rm 3   Vital Signs:  Patient profile:   55 year old female Height:      70 inches Weight:      190 pounds BMI:     27.36 Temp:     97.8 degrees F oral Pulse rate:   80 / minute Pulse rhythm:   regular Resp:     16 per minute BP sitting:   118 / 84  (left arm) Cuff size:   regular  Vitals Entered By: Lanier Prude, Beverly Gust) (June 07, 2010 11:36 AM) CC: 3 mo f/u Is Patient Diabetic? No Comments pt is not taking Premarin.  She states she is taking Valacyclovir 400GM 2 once daily.   Primary Care Provider:  Dondra Spry DO  CC:  3 mo f/u.  History of Present Illness:  Hypertension Follow-Up      This is a 56 year old woman who presents for Hypertension follow-up.  The patient denies lightheadedness and headaches.  The patient denies the following associated symptoms: chest pain.  Compliance with medications (by patient report) has been near 100%.  able to lose some weight attributes to stopping HRT    Preventive Screening-Counseling & Management  Alcohol-Tobacco     Alcohol drinks/day: 1 per day     Alcohol type: all     Alcohol Counseling: to decrease amount and/or frequency of alcohol intake     Smoking Status: never     Packs/Day: 0.5     Year Started: 1992     Year Quit: 2006     Pack years: 8     Tobacco Counseling: not to resume use of tobacco products  Current Medications (verified): 1)  Valacyclovir Hcl 1 Gm Tabs (Valacyclovir Hcl) .... One By Mouth Three Times A Day 2)  Premarin 0.625 Mg Tabs (Estrogens Conjugated) .... Take One Tablet By Mouth Once Daily 3)  Effexor Xr 150 Mg Xr24h-Cap (Venlafaxine Hcl) .... Take One Tablet By Mouth Once Daily 4)  Bisoprolol Fumarate 5 Mg Tabs (Bisoprolol Fumarate) .... 1/2 By Mouth Once Daily 5)  Synthroid 112 Mcg Tabs (Levothyroxine Sodium) .... Take 1 Tablet By Mouth Once A Day  Allergies (verified): No Known Drug Allergies  Past History:  Past Medical  History: Hypertension Sarcoidosis    Right knee meniscal tear      Hx of positive ANA Hx of genital ulcers - question Bechets   Past Surgical History:  T A H and B S O           Family History: Mother head and neck cancer Mother had AVR (aortic valve replacement) Sister with tachycardia Family History Hypertension- Mother maternl grandfather deceased age 41 Lung Cancer Maternal grandmother deceased age 20 colon cancer       Stepdaughter has papillary thyroid cancer      Physical Exam  General:  alert, well-developed, and well-nourished.   Lungs:  normal respiratory effort and normal breath sounds.   Heart:  normal rate, regular rhythm, and no gallop.   Extremities:  No lower extremity edema    Impression & Recommendations:  Problem # 1:  HYPOTHYROIDISM (ICD-244.9)  Her updated medication list for this problem includes:    Synthroid 137 Mcg Tabs (Levothyroxine sodium) .Marland Kitchen... 1 once daily  Labs Reviewed: TSH: 2.225 (05/29/2010)    HgBA1c: 5.7 (05/16/2009) Chol: 235 (10/19/2009)   HDL: 53 (10/19/2009)   LDL: 144 (10/19/2009)   TG: 191 (10/19/2009)  Problem # 2:  GOITER, MULTINODULAR (ICD-241.1)  FINAL  DIAGNOSIS   ***Microscopic Examination and Diagnosis***    1. THYROID, THYROIDECTOMY, : NODULAR HYPERPLASIA (MULTINODULAR GOITER).   2. SOFT TISSUE MASS, SIMPLE EXCISION, QUESTIONABLE LYMPH NODE, LEFT SIDE THYROID   : BENIGN THYROID TISSUE WITH HYPERPLASTIC NODULES.    *** Electronically Signed Out by Luisa Hart M.D., Jonny Ruiz, Pathologist, Electronic Signature ***  Problem # 3:  HYPERTENSION (ICD-401.9) Assessment: Unchanged  Her updated medication list for this problem includes:    Bisoprolol Fumarate 5 Mg Tabs (Bisoprolol fumarate) .Marland Kitchen... 1/2 by mouth once daily  Orders: T-Basic Metabolic Panel [phone]) T-Hepatic Function [phone]) T-Lipid Profile [phone])  BP today: 118/84 Prior BP: 116/70 (05/23/2010)  Labs Reviewed: K+: 4.2 (10/19/2009) Creat:  : 0.93 (10/19/2009)   Chol: 235 (10/19/2009)   HDL: 53 (10/19/2009)   LDL: 144 (10/19/2009)   TG: 191 (10/19/2009)  Complete Medication List: 1)  Valacyclovir Hcl 1 Gm Tabs (Valacyclovir hcl) .... One by mouth three times a day 2)  Effexor Xr 150 Mg Xr24h-cap (Venlafaxine hcl) .... Take one tablet by mouth once daily 3)  Bisoprolol Fumarate 5 Mg Tabs (Bisoprolol fumarate) .... 1/2 by mouth once daily 4)  Synthroid 137 Mcg Tabs (Levothyroxine sodium) .Marland Kitchen.. 1 once daily  Other Orders: T-TSH ([id number]) T-T4, Free [phone]) T- * Misc. Laboratory test [phone])  Patient Instructions: 1)  Please schedule a follow-up appointment in 6 months. Prescriptions: BISOPROLOL FUMARATE 5 MG TABS (BISOPROLOL FUMARATE) 1/2 by mouth once daily  #30 x 5   Entered and Authorized by:   D. Thomos Lemons DO   Signed by:   D. Thomos Lemons DO on 06/07/2010   Method used:   Electronically to        [facility] # [id number]* (retail)       [address] [place], [place]  [address]       Ph: [phone]       Fax: [phone]   RxID:   [id number]

## 2010-10-22 NOTE — Letter (Signed)
Summary: [facility]  [facility]   Imported By: Lanelle Bal 10/03/2009 10:43:24  _____________________________________________________________________  External Attachment:    Type:   Image     Comment:   External Document

## 2010-10-22 NOTE — Progress Notes (Signed)
Summary: WANTS TO TALK TO DR Jamilla Galli   Phone Note Call from Patient   Caller: Patient Call For: Margee Trentham  Summary of Call: SHE GOT THE RESULTS FROM THE THYROID SCAN FROM YESTERDAY.  SHE WANTS TO TALK TO YOU ABOUT THE RESULTS AND WHERE AND WHAT TO DO NEXT.  PLEASE CALL HER @ [phone]  IF SHE NEEDS TO GO TO A SPEICALIST ABOUT THIS SHE WOULD LIKE dR bURNS AT [facility] AND WOULD LIKE TO START ASAP Initial call taken by: Roselle Locus,  January 03, 2010 10:46 AM  Follow-up for Phone Call        If Dr. Lawerance Bach is endocrinologist, we can arrange referral.  I suggest getting FNA (fine needle aspiration).  this can be arranged through specialist office  I will try to call pt at end of day Follow-up by: D. Thomos Lemons DO,  January 03, 2010 1:23 PM  New Problems: GOITER, MULTINODULAR (ICD-241.1)   New Problems: GOITER, MULTINODULAR (ICD-241.1)

## 2010-10-22 NOTE — Progress Notes (Signed)
Summary: Health Provider Screening form  Phone Note Outgoing Call   Summary of Call: Health Provider Screening form faxed to [phone]. Nicki Guadalajara Fergerson CMA Duncan Dull)  June 10, 2010 8:38 AM

## 2010-10-22 NOTE — Progress Notes (Signed)
Summary: Not getting better  Phone Note Call from Patient   Caller: Patient Summary of Call: Patient is still having ringing in Left Ear and it is very sore. Pt. states that the nasal spray Dr.Charisma Charlot gave her is not working. She also states she is sore down on her left side of neck. Call Patient back [phone] Initial call taken by: Michaelle Copas,  November 21, 2009 9:43 AM  Follow-up for Phone Call        use ceftin as directed.  if persistent symptoms,  I suggest OV Follow-up by: D. Thomos Lemons DO,  November 21, 2009 10:12 AM  Additional Follow-up for Phone Call Additional follow up Details #1::        attempted to contact patient at [phone], no answer detailed voice message left informing patient per Dr Artist Pais isntructions Additional Follow-up by: Glendell Docker CMA,  November 21, 2009 10:45 AM    New/Updated Medications: CEFUROXIME AXETIL 500 MG TABS (CEFUROXIME AXETIL) one by mouth two times a day Prescriptions: CEFUROXIME AXETIL 500 MG TABS (CEFUROXIME AXETIL) one by mouth two times a day  #14 x 0   Entered and Authorized by:   D. Thomos Lemons DO   Signed by:   D. Thomos Lemons DO on 11/21/2009   Method used:   Electronically to        [facility] # [id number]* (retail)       [address] [place], [place]  [address]       Ph: [phone]       Fax: [phone]   RxID:   [id number]

## 2010-10-22 NOTE — Assessment & Plan Note (Signed)
Summary: 2 month follow up/mhf- rescheduled- jr   Vital Signs:  Patient profile:   55 year old female Weight:      202.50 pounds BMI:     29.16 O2 Sat:      100 % on Room air Temp:     97.8 degrees F oral Pulse rate:   64 / minute Pulse rhythm:   regular Resp:     18 per minute BP sitting:   100 / 70  (right arm) Cuff size:   large  Vitals Entered By: Glendell Docker CMA (October 25, 2009 3:11 PM)  O2 Flow:  Room air  Primary Care Provider:  D. Thomos Lemons DO  CC:  2 Month Follow up .  History of Present Illness: 2 Month  Follow up   Hypertension Follow-Up      This is a 55 year old woman who presents for Hypertension follow-up.  The patient reports lightheadedness.  Associated symptoms include palpitations.  The patient denies the following associated symptoms: chest pain and syncope.  Compliance with medications (by patient report) has been near 100%.  The patient reports that dietary compliance has been fair.  The patient reports exercising occasionally.    Allergies (verified): No Known Drug Allergies  Past History:  Past Medical History: Hypertension Sarcoidosis  Right knee meniscal tear      Past Surgical History:  T A H and B S O     Family History: Mother head and neck cancer Mother had AVR (aortic valve replacement) Sister with tachycardia Family History Hypertension- Mother maternl grandfather deceased age 33 Lung Cancer Maternal grandmother deceased age 90 colon cancer      Social History: occupation: Field seismologist for medical supplies [employer] Married 4 Years No children- 2 step dauhgters 20 & 22      Review of Systems       hair thinning,  nasal congestion.  she is frustrated with her weight.  she use to athlete when she was younger  Physical Exam  General:  alert, well-developed, and well-nourished.   Lungs:  normal respiratory effort and normal breath sounds.   Heart:  normal rate, regular rhythm, and no gallop.   Extremities:   No clubbing, cyanosis, edema Neurologic:  cranial nerves II-XII intact and gait normal.   Psych:  normally interactive and good eye contact.     Impression & Recommendations:  Problem # 1:  HYPERTENSION (ICD-401.9) BP labile.  pt on low dose of  Bisoprolol-hydrochlorothiazide.  dc hctz portion.   take 1/2 of bisoprolol  The following medications were removed from the medication list:    Bisoprolol-hydrochlorothiazide 5-6.25 Mg Tabs (Bisoprolol-hydrochlorothiazide) ..... One by mouth once daily Her updated medication list for this problem includes:    Bisoprolol Fumarate 5 Mg Tabs (Bisoprolol fumarate) .Marland Kitchen... 1/2 by mouth once daily  BP today: 100/70 Prior BP: 120/90 (08/14/2009)  Labs Reviewed: K+: 4.2 (10/19/2009) Creat: : 0.93 (10/19/2009)   Chol: 235 (10/19/2009)   HDL: 53 (10/19/2009)   LDL: 144 (10/19/2009)   TG: 191 (10/19/2009)  Complete Medication List: 1)  Acyclovir 400 Mg Tabs (Acyclovir) .... Take one tablet two times a day 2)  Premarin 0.625 Mg Tabs (Estrogens conjugated) .... Take one tablet by mouth once daily 3)  Effexor Xr 150 Mg Xr24h-cap (Venlafaxine hcl) .... Take one tablet by mouth once daily 4)  Bisoprolol Fumarate 5 Mg Tabs (Bisoprolol fumarate) .... 1/2 by mouth once daily 5)  Fluticasone Propionate 50 Mcg/act Susp (Fluticasone propionate) .Marland KitchenMarland KitchenMarland Kitchen  2 sprays each nostril once daily  Patient Instructions: 1)  Please schedule a follow-up appointment in 6 months. 2)  Lloyd Huger med sinus rinse 3)  [email or web address] 4)  Limit calorie intake to 1700-1800 cal per day Prescriptions: FLUTICASONE PROPIONATE 50 MCG/ACT SUSP (FLUTICASONE PROPIONATE) 2 sprays each nostril once daily  #1 x 3   Entered and Authorized by:   D. Thomos Lemons DO   Signed by:   D. Thomos Lemons DO on 10/25/2009   Method used:   Electronically to        [facility] # [id number]* (retail)       [address] [place], [place]  [address]       Ph: [phone]        Fax: [phone]   RxID:   [id number] BISOPROLOL FUMARATE 5 MG TABS (BISOPROLOL FUMARATE) 1/2 by mouth once daily  #30 x 5   Entered and Authorized by:   D. Thomos Lemons DO   Signed by:   D. Thomos Lemons DO on 10/25/2009   Method used:   Electronically to        [facility] # [phone]* (retail)       [address] [place], [place]  [address]       Ph: [phone]       Fax: [phone]   RxID:   [id number]    Immunization History:  Influenza Immunization History:    Influenza:  historical (08/07/2009)     Current Allergies (reviewed today): No known allergies

## 2010-10-22 NOTE — Progress Notes (Signed)
  Phone Note Call from Patient   Caller: Patient Details for Reason: Referral @ [facility]  Summary of Call: Appointment with Dr Lawerance Bach @ [facility] is scheduled for July 7th  , patient does not want to wait . please call her [phone] she has questions , Initial call taken by: Darral Dash,  January 03, 2010 3:18 PM  Follow-up for Phone Call        left message - I will call back tomorrow to discuss thyroid u/s findings Follow-up by: D. Thomos Lemons DO,  January 03, 2010 5:20 PM  Additional Follow-up for Phone Call Additional follow up Details #1::        first appt wtih Dr. Lawerance Bach not until July. I advise arranging FNA at [facility] - see orders Additional Follow-up by: D. Thomos Lemons DO,  January 04, 2010 1:08 PM    Additional Follow-up for Phone Call Additional follow up Details #2::    Request for FNA  to [facility]  , they will schedule appt and call pt Follow-up by: Darral Dash,  January 04, 2010 3:41 PM

## 2010-10-22 NOTE — Assessment & Plan Note (Signed)
Summary: ringing in both ears, itching in Left ear- jr   Vital Signs:  Patient profile:   55 year old female Height:      70 inches Weight:      206 pounds BMI:     29.66 O2 Sat:      100 % on Room air Temp:     97.7 degrees F oral Pulse rate:   61 / minute Pulse rhythm:   regular Resp:     16 per minute BP sitting:   112 / 70 Cuff size:   large  Vitals Entered By: Glendell Docker CMA (December 14, 2009 3:48 PM)  O2 Flow:  Room air CC: Rm 3- Ear discomfort   Primary Care Provider:  Dondra Spry DO  CC:  Rm 3- Ear discomfort.  History of Present Illness: 55 y/o female c/o unresolved ringing in  ears. ears feel full.   no improvement with abx  c/o stiff joint and hand pain. She fell on her right hand in December- she has trouble with her right pinky since them.  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   Eyes:  pupils equal, pupils round, and pupils reactive to light.   Ears:  Left TM retracted Lungs:  normal respiratory effort and normal breath sounds.   Heart:  normal rate, regular rhythm, no murmur, and no gallop.   Msk:  right 5th digit PIP swollen.  no redness.  mild tenderness.  limited flexion   Impression & Recommendations:  Problem # 1:  HAND PAIN, RIGHT (ICD-729.5)  fell on right  hand injurying 5 th digit in december.  She has chronic swelling and pain of PIP.  no improvement with PT.  refer to Dr. Teressa Senter  Orders: Orthopedic Referral (Ortho)  Problem # 2:  DYSFUNCTION OF EUSTACHIAN TUBE (ICD-381.81) Pt c/o ears ringing.  Left TM retracted.  use nasal steroids.  If persistent symptoms, consider ENT referral.  no improvement with ceftin  Complete Medication List: 1)  Acyclovir 400 Mg Tabs (Acyclovir) .... Take one tablet two times a day 2)  Premarin 0.625 Mg Tabs (Estrogens conjugated) .... Take one tablet by mouth once daily 3)  Effexor Xr 150 Mg Xr24h-cap (Venlafaxine hcl) .... Take one tablet by mouth once daily 4)  Bisoprolol Fumarate 5 Mg Tabs (Bisoprolol fumarate) .... 1/2 by mouth once daily 5)  Fluticasone Propionate 50 Mcg/act Susp (Fluticasone propionate) .... 2 sprays each nostril once daily  Patient Instructions: 1)  Please schedule a follow-up appointment in 6 months. Prescriptions: FLUTICASONE PROPIONATE 50 MCG/ACT SUSP (FLUTICASONE PROPIONATE) 2 sprays each nostril once daily  #1 x 5   Entered and Authorized by:   D. Thomos Lemons DO   Signed by:   D. Thomos Lemons DO on 12/14/2009   Method  used:   Electronically to        [facility] # [id number]* (retail)       [address] [place], [place]  [address]       Ph: [phone]       Fax: [phone]   RxID:   [id number]   Current Allergies (reviewed today): No known allergies

## 2010-10-22 NOTE — Progress Notes (Signed)
Summary: Swelling of the Eye  Phone Note Call from Patient Call back at Home Phone [phone]   Summary of Call: patient called and stated she was seen a few weeks ag o for an earache, and she has since traveled out of town to [place], upon her return she states that her left ear is still bothering her, and she is now having swelling in her left eye with redness at the top. Patient was aksed if she has a eye doctor that she could see, she states she does not. She stated she has an appointment with the Hand Specialtist this morning at 10, and she wanted to know what she should do. Patient was advised to schedule appointment with Dr Artist Pais for evaluation.   After informing Dr Artist Pais of patient status she was called back and informed that she would need to be seen right away for evaluation. She states she will come over to our office after the visit with the Hand Specialist Initial call taken by: Glendell Docker CMA,  December 24, 2009 10:25 AM

## 2010-10-22 NOTE — Letter (Signed)
   [place] at [facility] [address] [place], [place]  [address]  [place] Phone: [phone]      July 22, 2010   Shannon Wright [address] [facility] [place], [place] [address]  RE:  LAB RESULTS  Dear  [name],  The following is an interpretation of your most recent lab tests.  Please take note of any instructions provided or changes to medications that have resulted from your lab work.  ELECTROLYTES:  Good - no changes needed  KIDNEY FUNCTION TESTS:  Good - no changes needed  LIPID PANEL:  Fair - review at your next visit Triglyceride: 135   Cholesterol: 226   LDL: 150   HDL: 49   Chol/HDL%:  4.6 Ratio  THYROID STUDIES:  Thyroid studies normal TSH: 0.235     Tests: (4) TSH (23280)   TSH                  [L]  0.235 uIU/mL                0.350-4.500  Tests: (5) T4, Free (23300)   Free T4                   1.57 ng/dL                  0.80-1.80  Tests: (6) T3, Free (85320)   T3, Free                  3.2 pg/mL                   2.3-4.2   CRP - 0.3 (normal)   Please see handout on low cholesterol diet.      Sincerely Yours,    Dr. Thomos Lemons  Appended Document:  mailed

## 2010-10-22 NOTE — Letter (Signed)
Summary: [facility]   Imported By: Lanelle Bal 05/03/2010 11:57:55  _____________________________________________________________________  External Attachment:    Type:   Image     Comment:   External Document

## 2010-10-22 NOTE — Letter (Signed)
Summary: Primary Care Consult Scheduled Letter  [place] at [facility]  [address]   [place], [place] [address]   Phone: [phone]  Fax: [phone]      01/24/2010 MRN: [id number]  BERNADETT MILIAN [address] [place], [place]  [address]    Dear Ms. Patrecia Pace,      We have scheduled an appointment for you.  At the recommendation of Dr.YOO, we have scheduled you a consult with DR Talmage Nap ENDOCRINOLOGY, [facility] on _JUNE 14,2011  at 9:30AM.  Their address is_[address],[place]  [phone]. The office phone number is [phone].  If this appointment day and time is not convenient for you, please feel free to call the office of the doctor you are being referred to at the number listed above and reschedule the appointment.     It is important for you to keep your scheduled appointments. We are here to make sure you are given good patient care. If you have questions or you have made changes to your appointment, please notify us at  [phone], ask for HELEN.    Thank you, Darral Dash Patient Care Coordinator [place] at [facility]

## 2010-11-21 ENCOUNTER — Ambulatory Visit (INDEPENDENT_AMBULATORY_CARE_PROVIDER_SITE_OTHER): Payer: Managed Care, Other (non HMO) | Admitting: Critical Care Medicine

## 2010-11-21 ENCOUNTER — Encounter: Payer: Self-pay | Admitting: Critical Care Medicine

## 2010-11-21 DIAGNOSIS — D869 Sarcoidosis, unspecified: Secondary | ICD-10-CM

## 2010-11-28 NOTE — Assessment & Plan Note (Addendum)
Summary: Pulmonary OV   Copy to:  Dr. Jannifer Franklin Primary Provider/Referring Provider:  Dondra Spry DO  CC:  6 month follow up.  Pt states breathing is doing well overall.  Chest tightness but relates this to anxiety.  Denies SOB, wheezing, and cough..  History of Present Illness: Pulmonary OV   This is a 55  year old female  with sarcoidosis  Stage III  03/22/09: Pt symptoms begain 2/10.  Pt was noting dyspnea with running through airports.  Had routine Px and abn CXR seen.  Subsequently had CT Chest with bilat upper lobe perivascular nodular infiltrates. Pt referred to [facility] and saw E [place].  Had TBBx FOB with granulomas seen on bx. Pt rx pred 40mg  /d and due to delay in getting back to pulmonary had slow pred taper.  Pt was reduced end of 5/10 to 30mg /d and held there.  lots of side effects with pred including increase weight gain, bloating , HR increase.   At out set pt was asymptomatic except with exertion on extreme.  Pt had pfts ?results.   In 5/10 had chst pain in airport in [place] and taken off airplane and w/u neg for cardiac dz. Pt desires second opinion to determine treatment plan for sarcoid.  Currently is not having cough or dyspnea.  No chest pain.  Pt does note heartburn and sore throat.  April 19, 2009 12:21 PM Has weaned off prednisone then became weak with fatigue, night sweats, headaches, listless, temp low grade.  Pt is flushed. Cannot handle the extreme heat. Nots sl non prod cough.  Pndrip is noted.  May 10, 2009 11:29 AM The pt is slowly better. There is less weakness.  The pt has been off pred since 7/20.  Cortisol was low around 7.0.  No more fever.  The pt still has some sweats.  No real cough.  No energy.  Pt is off HCTZ.   There are not any alleviating or precipitating factors noted.  The symptoms do not generally fluctuate. Pt denies any significant sore throat, nasal congestion or excess secretions, fever, chills, sweats, unintended  weight loss, pleurtic or exertional chest pain, orthopnea PND, or leg swelling Pt denies any increase in rescue therapy over baseline, denies waking up needing it or having any early am or nocturnal exacerbations of coughing/wheezing/or dyspnea.   August 09, 2009 11:02 AM ? of mold issue in [place] home wiht black mold occ has chest heaviness and still with cough and clears throat.  Non prod cough Off pred since 7/20.  Still no energy.  Saw Dr Artist Pais in 8/10 and stopped metoprolol ? steroid issue off all HTN meds for one month occ has tachycardia and pulse is erratic , notes palpitations did wean the metoprolol slowly 160/98 has been dx with HTN in the past . prior to sarcoid isse Jan 24, 2010 11:29 AM No change in lungs.  Bx on thyroid  for a nodule No real cough,  except occ raspy cough,  Not dyspneic with exertion. father has pulmonary fibrosis.   May 23, 2010 12:41 PM no difficulty with breathing or cough.  no new issues. had CT chest 5/11 and did this did not show any changes Pt denies any significant sore throat, nasal congestion or excess secretions, fever, chills, sweats, unintended weight loss, pleurtic or exertional chest pain, orthopnea PND, or leg swelling Pt denies any increase in rescue therapy over baseline, denies waking up needing it or having any early am  or nocturnal exacerbations of coughing/wheezing/or dyspnea.   November 21, 2010 2:50 PM Had a head cold ,  otherwise doing well.  Sl cough and not productive and now sl pn drip.  No chest pains.  ? anxiety.     Has a stressful job.  took husbands xanax to relax self.    Clinical Reports Reviewed:  CT of Chest:  02/07/2010: CT of Chest:  Findings: No prior CT of the chest is available for comparison.  On the present study  multiple small nodules are present which are primarily perilymphatic in distribution, with upper lobe predominance.  There is some nodularity of the fissures, and there is interlobular  septal thickening present.  Slightly more prominent nodules also are present scattered throughout the lungs with a nodule adjacent to the right heart border measuring 7 mm in diameter.  These findings are very typical of sarcoidosis in the early and/or active phase.  No bronchiectasis is seen and no honeycombing is noted.  If there is a prior CT the chest , direct comparison is recommended to assess interval change.  No pleural effusion is seen.  On soft tissue window images there may be small low attenuation thyroid nodules present.  No definite mediastinal or hilar adenopathy is seen, with a calcified left hilar node present.  What is seen of the upper abdomen is unremarkable.  No bony abnormality is noted.  IMPRESSION: Multiple small perilymphatic nodules scattered throughout primarily the upper lobes with some nodularity of the fissures and interlobular septal thickening consistent with changes of sarcoidosis.  No adenopathy is seen.   12/19/2008: CT of Chest:  Diffuse peribronchovascular and subpleural micronodularity in the upper lobes, No pleural effusions or mediastinal LAN.  innummerable splenic nodules     Current Medications (verified): 1)  Valacyclovir Hcl 1 Gm Tabs (Valacyclovir Hcl) .... Take 1 Tablet By Mouth Two Times A Day 2)  Effexor Xr 150 Mg Xr24h-Cap (Venlafaxine Hcl) .... Take One Tablet By Mouth Once Daily 3)  Bisoprolol Fumarate 5 Mg Tabs (Bisoprolol Fumarate) .... 1/2 By Mouth Once Daily 4)  Synthroid 137 Mcg Tabs (Levothyroxine Sodium) .Marland Kitchen.. 1 Once Daily  Allergies (verified): No Known Drug Allergies  Past History:  Past medical, surgical, family and social histories (including risk factors) reviewed, and no changes noted (except as noted below).  Past Medical History: Hypertension Sarcoidosis    Right knee meniscal tear      Hx of positive ANA Hx of genital ulcers - question Bechets  Chronic anxiety   Past Surgical History: Thyroidectomy  total T A H and B S O           Family History: Reviewed history from 06/07/2010 and no changes required. Mother head and neck cancer Mother had AVR (aortic valve replacement) Sister with tachycardia Family History Hypertension- Mother Nelva Nay grandfather deceased age 71 Lung Cancer Maternal grandmother deceased age 68 colon cancer       Stepdaughter has papillary thyroid cancer      Social History: Reviewed history from 05/23/2010 and no changes required. 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  Quit in 2006.  < 1/2 ppd x 10 yrs.     Review of Systems  The patient denies shortness of breath with activity, shortness of breath at rest, productive cough, non-productive cough, coughing up blood, chest pain, irregular heartbeats, acid heartburn,  indigestion, loss of appetite, weight change, abdominal pain, difficulty swallowing, sore throat, tooth/dental problems, headaches, nasal congestion/difficulty breathing through nose, sneezing, itching, ear ache, anxiety, depression, hand/feet swelling, joint stiffness or pain, rash, change in color of mucus, and fever.         anxiety   Vital Signs:  Patient profile:   55 year old female Height:      70 inches Weight:      193.31 pounds BMI:     27.84 O2 Sat:      100 % on Room air Temp:     97.6 degrees F oral Pulse rate:   77 / minute BP sitting:   100 / 70  (left arm) Cuff size:   regular  Vitals Entered By: Gweneth Dimitri RN (November 21, 2010 2:34 PM)  O2 Flow:  Room air CC: 6 month follow up.  Pt states breathing is doing well overall.  Chest tightness but relates this to anxiety.  Denies SOB, wheezing, cough. Comments Medications reviewed with patient Daytime contact number verified with patient. Gweneth Dimitri RN  November 21, 2010 2:34 PM    Physical Exam  Additional Exam:  Gen: Pleasant, well-nourished,  mildly obese , in no distress,  normal affect;  ENT: No lesions,  mouth clear,  oropharynx clear, no postnasal drip Neck: No JVD, no TMG, no carotid bruits Lungs: No use of accessory muscles, no dullness to percussion, clear without rales or rhonchi Cardiovascular: RRR, heart sounds normal, no murmur or gallops, no peripheral edema Abdomen: soft and NT, no HSM,  BS normal Musculoskeletal: No deformities, no cyanosis or clubbing Neuro: alert, non focal Skin: Warm, no lesions or rashes Lymph:  no peripheral lymphadenopathy   Impression & Recommendations:  Problem # 1:  SARCOIDOSIS (ICD-135) Assessment Improved  stable sarcoidosis. plan no prednisone for now monitor  Medications Added to Medication List This Visit: 1)  Valacyclovir Hcl 1 Gm Tabs (Valacyclovir hcl) .... Take 1 tablet by mouth two times a day  Complete Medication List: 1)  Valacyclovir Hcl 1 Gm Tabs (Valacyclovir hcl) .... Take 1 tablet by mouth two times a day 2)  Effexor Xr 150 Mg Xr24h-cap (Venlafaxine hcl) .... Take one tablet by mouth once daily 3)  Bisoprolol Fumarate 5 Mg Tabs (Bisoprolol fumarate) .... 1/2 by mouth once daily 4)  Synthroid 137 Mcg Tabs (Levothyroxine sodium) .Marland Kitchen.. 1 once daily  Patient Instructions: 1)  No change in medications 2)  Work on relaxation techniques 3)  Return in     6-8     months   Immunization History:  Influenza Immunization History:    Influenza:  historical (07/23/2010)

## 2010-12-05 ENCOUNTER — Ambulatory Visit (INDEPENDENT_AMBULATORY_CARE_PROVIDER_SITE_OTHER): Payer: Managed Care, Other (non HMO) | Admitting: Internal Medicine

## 2010-12-05 ENCOUNTER — Encounter: Payer: Self-pay | Admitting: Internal Medicine

## 2010-12-05 DIAGNOSIS — N951 Menopausal and female climacteric states: Secondary | ICD-10-CM | POA: Insufficient documentation

## 2010-12-05 DIAGNOSIS — E039 Hypothyroidism, unspecified: Secondary | ICD-10-CM

## 2010-12-05 DIAGNOSIS — R209 Unspecified disturbances of skin sensation: Secondary | ICD-10-CM | POA: Insufficient documentation

## 2010-12-05 DIAGNOSIS — I10 Essential (primary) hypertension: Secondary | ICD-10-CM

## 2010-12-05 LAB — CONVERTED CEMR LAB
CO2: 26 meq/L (ref 19–32)
Calcium: 10.2 mg/dL (ref 8.4–10.5)
Creatinine, Ser: 1 mg/dL (ref 0.40–1.20)
Glucose, Bld: 96 mg/dL (ref 70–99)
Potassium: 4.5 meq/L (ref 3.5–5.3)
Sodium: 139 meq/L (ref 135–145)

## 2010-12-06 ENCOUNTER — Telehealth: Payer: Self-pay | Admitting: Internal Medicine

## 2010-12-07 LAB — COMPREHENSIVE METABOLIC PANEL
ALT: 23 U/L (ref 0–35)
Alkaline Phosphatase: 76 U/L (ref 39–117)
GFR calc non Af Amer: >60 mL/min (ref >60)
Potassium: 4.1 mEq/L (ref 3.5–5.1)
Sodium: 137 mEq/L (ref 135–145)
Total Bilirubin: 0.6 mg/dL (ref 0.3–1.2)

## 2010-12-07 LAB — SURGICAL PCR SCREEN: Staphylococcus aureus: NEGATIVE

## 2010-12-11 LAB — BASIC METABOLIC PANEL
BUN: 12 mg/dL (ref 6–23)
Calcium: 10.1 mg/dL (ref 8.4–10.5)
GFR calc non Af Amer: >60 mL/min (ref >60)
Glucose, Bld: 88 mg/dL (ref 70–99)
Sodium: 143 mEq/L (ref 135–145)

## 2010-12-11 LAB — CBC
Platelets: 256 10*3/uL (ref 150–400)
RDW: 11.7 % (ref 11.5–15.5)
WBC: 8.6 10*3/uL (ref 4.0–10.5)

## 2010-12-11 LAB — DIFFERENTIAL
Basophils Absolute: 0.2 10*3/uL — ABNORMAL HIGH (ref 0.0–0.1)
Basophils Relative: 2 % — ABNORMAL HIGH (ref 0–1)
Eosinophils Absolute: 0.4 10*3/uL (ref 0.0–0.7)
Monocytes Absolute: 1 10*3/uL (ref 0.1–1.0)
Monocytes Relative: 12 % (ref 3–12)
Neutrophils Relative %: 65 % (ref 43–77)

## 2010-12-19 NOTE — Progress Notes (Signed)
Summary: Lab Results  Phone Note Outgoing Call   Summary of Call: call pt - blood test shows normal electrolytes and kidney function.  thyroid blood test suggest pt still getting too much thyroid hormone.  Fax results to Dr. Horald Pollen.  I will defer medication dosage adjustments to Dr. Horald Pollen   Initial call taken by: D. Thomos Lemons DO,  December 06, 2010 1:37 PM  Follow-up for Phone Call        call placed to patient at [phone], no answer. A detailed voice message was left informing patient per Dr Artist Pais instructions. Follow-up by: Glendell Docker CMA,  December 06, 2010 5:27 PM

## 2010-12-24 NOTE — Assessment & Plan Note (Signed)
Summary: 6 month follow up/dt   Vital Signs:  Patient profile:   55 year old female Height:      70 inches Weight:      193 pounds BMI:     27.79 O2 Sat:      100 % on Room air Temp:     98.3 degrees F oral Pulse rate:   68 / minute Resp:     16 per minute BP sitting:   104 / 80  (right arm) Cuff size:   large  Vitals Entered By: Glendell Docker CMA (December 05, 2010 2:18 PM)  O2 Flow:  Room air CC: 6 month follow up  Is Patient Diabetic? No Pain Assessment Patient in pain? no      Comments hot flashes, balls of feet feels as if she is walking on marbles, and she has nubmbness    Primary Care Provider:  D. Thomos Lemons DO  CC:  6 month follow up .  History of Present Illness: 55 y/o female for follow up she c/o severe intermittent hot flashes HRT stopped by pt due to concerns re:  risk of breast cancer complete hysterectomy 2005  bilateral foot numbness L > R numbness, feels like she is walking on marbles  been on effexor xr 4 yrs  Preventive Screening-Counseling & Management  Alcohol-Tobacco     Smoking Status: never  Allergies (verified): No Known Drug Allergies  Past History:  Past Medical History: Hypertension Sarcoidosis     Right knee meniscal tear      Hx of positive ANA Hx of genital ulcers - question Bechets  Chronic anxiety   Past Surgical History: Thyroidectomy total T A H and B S O    2005        Family History: Mother head and neck cancer Mother had AVR (aortic valve replacement) Sister with tachycardia Family History Hypertension- Mother maternl grandfather deceased age 85 Lung Cancer Maternal grandmother deceased age 104 colon cancer       Stepdaughter has papillary thyroid cancer       Social History: occupation: Field seismologist for medical supplies [employer] Married 4 Years No children- 2 step dauhgters 20 & 22       Hobby - scuba diving  Former smoker.  Quit in 2006.  < 1/2 ppd x 10 yrs.     Physical Exam  General:   alert, well-developed, and well-nourished.   Head:  normocephalic and atraumatic.   Lungs:  normal respiratory effort and normal breath sounds.   Heart:  normal rate, regular rhythm, and no gallop.     Impression & Recommendations:  Problem # 1:  PARESTHESIA (ICD-782.0) abnormal foot sensation of unclear etiology. I doubt neuropathy.  Pt reports prev EMG normal.  question morton's neuroma  Orders: Orthopedic Referral (Ortho)  Problem # 2:  HOT FLASHES (ICD-627.2)  Her updated medication list for this problem includes:    Premarin 0.625 Mg Tabs (Estrogens conjugated) ..... One by mouth once daily  restart HRT.  we discussed potential side effects  Problem # 3:  HYPERTENSION (ICD-401.9) Assessment: Unchanged  Her updated medication list for this problem includes:    Bisoprolol Fumarate 5 Mg Tabs (Bisoprolol fumarate) .Marland Kitchen... 1/2 by mouth once daily  Orders: T-Basic Metabolic Panel ([id number])  BP today: 104/80 Prior BP: 100/70 (11/21/2010)  Labs Reviewed: K+: 4.5 (07/19/2010) Creat: : 0.88 (07/19/2010)   Chol: 226 (07/19/2010)   HDL: 49 (07/19/2010)   LDL: 150 (07/19/2010)   TG: 135 (07/19/2010)  Complete Medication List: 1)  Valacyclovir Hcl 1 Gm Tabs (Valacyclovir hcl) .... Take 1 tablet by mouth two times a day 2)  Effexor Xr 150 Mg Xr24h-cap (Venlafaxine hcl) .... Take one tablet by mouth once daily 3)  Bisoprolol Fumarate 5 Mg Tabs (Bisoprolol fumarate) .... 1/2 by mouth once daily 4)  Synthroid 125 Mcg Tabs (Levothyroxine sodium) .... One by mouth once daily 5)  Premarin 0.625 Mg Tabs (Estrogens conjugated) .... One by mouth once daily  Other Orders: T-TSH ([id number]) T-T4, Free [phone])  Patient Instructions: 1)  Please schedule a follow-up appointment in 3 months. 2)  Our office will contact you re:  referral to Dr. Victorino Dike Prescriptions: PREMARIN 0.625 MG TABS (ESTROGENS CONJUGATED) one by mouth once daily  #30 x 5   Entered and Authorized by:   D.  Thomos Lemons DO   Signed by:   D. Thomos Lemons DO on 12/05/2010   Method used:   Electronically to        [facility] [phone]* (retail)       [address] [place], [place]  [address]       Ph: [phone]       Fax: [phone]   RxID:   [id number]    Orders Added: 1)  T-TSH [phone] 2)  T-T4, Free [[id number]] 3)  T-Basic Metabolic Panel [phone] 4)  Orthopedic Referral [Ortho] 5)  Est. Patient Level III [37106]    Current Allergies (reviewed today): No known allergies    Preventive Care Screening  Mammogram:    Date:  04/29/2010    Results:  normal   Pap Smear:    Date:  04/29/2010    Results:  normal

## 2011-02-18 ENCOUNTER — Telehealth: Payer: Self-pay | Admitting: Internal Medicine

## 2011-02-18 NOTE — Telephone Encounter (Signed)
Pt states she ran out of bisoprolol?(sp) A bp med. over the weekend. She has contacted [facility](where she gets her meds)but med will not be here for another week. Pt wants refill sent to [facility] on [address] so she will not miss her med for a week.

## 2011-02-19 MED ORDER — BISOPROLOL FUMARATE 5 MG PO TABS: 5.0000 mg | ORAL_TABLET | Freq: Every day | ORAL | Status: DC

## 2011-02-19 NOTE — Telephone Encounter (Signed)
Rx refill sent to pharmacy. 

## 2011-02-21 ENCOUNTER — Telehealth: Payer: Self-pay | Admitting: Internal Medicine

## 2011-02-21 ENCOUNTER — Telehealth: Payer: Self-pay | Admitting: *Deleted

## 2011-02-21 MED ORDER — BISOPROLOL FUMARATE 5 MG PO TABS: ORAL_TABLET | ORAL | Status: DC

## 2011-02-21 NOTE — Telephone Encounter (Signed)
Bisoprolol directions are actually 1/2 tablet daily. Will send # 90 x no refills.

## 2011-02-21 NOTE — Telephone Encounter (Signed)
OK to give #90 with zero refills 

## 2011-02-21 NOTE — Telephone Encounter (Signed)
Opened in error

## 2011-02-21 NOTE — Telephone Encounter (Signed)
Received message from [facility] requesting refill on pt's Bisoprolol 5mg  1 tablet daily. #90. Pt has f/u with Dr Artist Pais on 03/14/11. Pt last seen on 12/05/10. Please advise # refills pt may have.

## 2011-02-27 ENCOUNTER — Ambulatory Visit: Payer: Managed Care, Other (non HMO) | Admitting: Internal Medicine

## 2011-03-14 ENCOUNTER — Ambulatory Visit: Payer: Managed Care, Other (non HMO) | Admitting: Internal Medicine

## 2011-03-20 ENCOUNTER — Encounter: Payer: Self-pay | Admitting: Internal Medicine

## 2011-04-16 ENCOUNTER — Telehealth: Payer: Self-pay | Admitting: Critical Care Medicine

## 2011-04-16 NOTE — Telephone Encounter (Signed)
i would resume at 50mg  per day Dr Artist Pais has backup coverage and she should call his office to be seen by the backup MD at [facility]

## 2011-04-16 NOTE — Telephone Encounter (Signed)
I called and LMOM for pt to be aware of recs, want to be sure she received this, so will await a call back tomorrow.

## 2011-04-16 NOTE — Telephone Encounter (Signed)
Encounter closed in error.

## 2011-04-16 NOTE — Telephone Encounter (Addendum)
Spoke with pt. She states that she was advised by Dr's Delford Field and Artist Pais that she should ween off of the effexor due to heart palpitations and increased BP. She states that over the past few months, has decreased from taking 150 mg to 100 mg and then down to 50 mg. She states that she has last taken med on 04/13/11 and is now having withdrawal symptoms. She states that she is feeling anxious, angry, dizzy and has diarrhea. She states that she would have called Dr Artist Pais, but received a letter from him that he is no longer practicing and she has no other PCP lined up yet. Would like recs from Dr Delford Field in the meantime. Please advise, thanks!

## 2011-04-17 NOTE — Telephone Encounter (Signed)
[email or web address]  

## 2011-07-03 ENCOUNTER — Encounter: Payer: Self-pay | Admitting: Internal Medicine

## 2011-07-03 ENCOUNTER — Ambulatory Visit (INDEPENDENT_AMBULATORY_CARE_PROVIDER_SITE_OTHER): Payer: Managed Care, Other (non HMO) | Admitting: Internal Medicine

## 2011-07-03 DIAGNOSIS — Z780 Asymptomatic menopausal state: Secondary | ICD-10-CM

## 2011-07-03 DIAGNOSIS — N951 Menopausal and female climacteric states: Secondary | ICD-10-CM

## 2011-07-03 DIAGNOSIS — R768 Other specified abnormal immunological findings in serum: Secondary | ICD-10-CM | POA: Insufficient documentation

## 2011-07-03 DIAGNOSIS — C4491 Basal cell carcinoma of skin, unspecified: Secondary | ICD-10-CM | POA: Insufficient documentation

## 2011-07-03 DIAGNOSIS — E785 Hyperlipidemia, unspecified: Secondary | ICD-10-CM

## 2011-07-03 DIAGNOSIS — F419 Anxiety disorder, unspecified: Secondary | ICD-10-CM | POA: Insufficient documentation

## 2011-07-03 DIAGNOSIS — Z23 Encounter for immunization: Secondary | ICD-10-CM

## 2011-07-03 DIAGNOSIS — M2550 Pain in unspecified joint: Secondary | ICD-10-CM

## 2011-07-03 DIAGNOSIS — M1990 Unspecified osteoarthritis, unspecified site: Secondary | ICD-10-CM | POA: Insufficient documentation

## 2011-07-03 NOTE — Patient Instructions (Signed)
Ok for OTC capsacian creme  Labs will be mailed to you  See me in 2-3 weeks

## 2011-07-03 NOTE — Progress Notes (Signed)
Subjective:    Patient ID: Shannon Wright, female    DOB: 02-Oct-1955, 55 y.o.   MRN: [id number]  HPI New pt. Here for first visit.  Former pt of Dr. Artist Pais and also sees Dr. Delford Field, Dr. Talmage Nap, Dr. Logan Bores [facility], Dr Christen Bame  [facility], Dr. Reche Dixon [facility], and gyn Dr. Altamese [place].  PMH of sarcoidosis, MNG S/P total thyroidectomy, HTN, arthritis, hyperlipidemia, symptomatic menopause, and basal cell skin CA.  Beth most recently saw an integrative NP Tammy worrell in 8/12 who did lab work showing a minimally elevated CA at 10.3.  She went because she is having lots of night sweats, and  joint pains  She was prescribed lots of vitamins and supplements and Biest hormone creme.  Of note her lipid profile was also elelvated with a total chol of 266 and LDL of 180, TG 182.  Beth reports she did have repeat labs done 6 days ago but I do not have results.  She wonders if she may have a parathyroid problem.    She also describes that she has a long history of high titer ANA , has had extensive work up with Dr. Christen Bame rheumatologist at [facility] but results nonspecific  She denies skin rashes, alopecia, she does have lots of aching in hands and feet.  Toes occasionally numb.  Shehas been seen by podiatrist and also had a NCV of feet but I do not have any of those records  No Known Allergies Past Medical History  Diagnosis Date  . Sarcoidosis   . Right knee meniscal tear   . ANA positive   . Genital ulcer, female     question Bechets  . Menopause   . Anxiety     chronic  . Arthritis   . Hypertension   . Hyperlipidemia   . Thyroid disease     MNG S/P total lthyroidectomy  . Basal cell carcinoma of skin    Past Surgical History  Procedure Date  . Total thyroidectomy   . Abdominal hysterectomy 2005  . Bilateral salpingoophorectomy 2005   History   Social History  . Marital Status: Married    Spouse Name: Bill    Number of Children: 0  . Years of Education: N/A   Occupational History  . VP    Social  History Main Topics  . Smoking status: Former Smoker -- 0.2 packs/day for 10 years    Quit date: 09/22/2004  . Smokeless tobacco: Not on file  . Alcohol Use: 1.2 oz/week    2 Glasses of wine per week  . Drug Use: No  . Sexually Active: Yes    Birth Control/ Protection: Surgical   Other Topics Concern  . Not on file   Social History Narrative   2 Step DaughtersHobby - scuba diving   Family History  Problem Relation Age of Onset  . Cancer Mother     head and neck  . Other Mother     aortic valve replacement  . Hypertension Mother   . Arthritis Mother   . Osteoporosis Mother   . Other Sister     tachycardia  . Cancer Maternal Grandmother     Colon  . Cancer Maternal Grandfather     Lung   Patient Active Problem List  Diagnoses  . SARCOIDOSIS  . THYROID NODULE, LEFT  . GOITER, MULTINODULAR  . HYPOTHYROIDISM  . GLUCOCORTICOID DEFICIENCY  . PERIORBITAL CELLULITIS  . DYSFUNCTION OF EUSTACHIAN TUBE  . TINNITUS  . HYPERTENSION  . HAND PAIN, RIGHT  .  PALPITATIONS, OCCASIONAL  . HYPERGLYCEMIA  . PARESTHESIA  . HOT FLASHES  . Anxiety  . Arthritis  . Hyperlipidemia  . Basal cell carcinoma of skin  . ANA positive  . Menopause   Current Outpatient Prescriptions on File Prior to Visit  Medication Sig Dispense Refill  . bisoprolol (ZEBETA) 5 MG tablet 1/2 tablet by mouth once a day  45 tablet  1  . levothyroxine (SYNTHROID, LEVOTHROID) 125 MCG tablet Take 125 mcg by mouth daily.        Marland Kitchen estrogens, conjugated, (PREMARIN) 0.625 MG tablet Take 0.625 mg by mouth daily. Take daily for 21 days then do not take for 7 days.       Marland Kitchen venlafaxine (EFFEXOR-XR) 150 MG 24 hr capsule Take 150 mg by mouth daily.             Review of Systems see HPI   Objective:   Physical Exam Physical Exam  Nursing note and vitals reviewed.  Constitutional: She is oriented to person, place, and time. She appears well-developed and well-nourished.  HENT:  Head: Normocephalic and atraumatic.   Cardiovascular: Normal rate and regular rhythm. Exam reveals no gallop and no friction rub.  No murmur heard.  Pulmonary/Chest: Breath sounds normal. She has no wheezes. She has no rales.  Neurological: She is alert and oriented to person, place, and time.  Skin: Skin is warm and dry.  Psychiatric: She has a normal mood and affect. Her behavior is normal.  EXT no edema       Assessment & Plan:  1)  Minimal hypercalcemia:  Likely due to sarcoidosis but will check PTH today.  Advised not to take OTC calcium  2)  Hyperlipidemia  Will need to ge tfasting  labs from [facility] from 6 days ago.  If still elevated may need treatment.   3)  Sarcoidosis:  Will give influenza vaccine today  She has had pneumovax 4)  Symptomatic menopause  Advised to come off HT from compounding pharmacy.  Will reschedule visit to discuss if estradiol indicated for hot flushes 5)  Arthralgias:  Okay to try OTC capsacian creme.  If not effective will consider Voltaren gel.  She voices Meloxicam has worked in the past  See complete problem list  I spent 45 minutes with this pt

## 2011-07-07 NOTE — Progress Notes (Signed)
Addended by: Chip Boer on: 07/07/2011 09:55 AM   Modules accepted: Orders

## 2011-07-08 ENCOUNTER — Encounter: Payer: Self-pay | Admitting: Internal Medicine

## 2011-07-08 ENCOUNTER — Ambulatory Visit: Payer: Managed Care, Other (non HMO) | Admitting: Internal Medicine

## 2011-07-08 ENCOUNTER — Telehealth: Payer: Self-pay | Admitting: Internal Medicine

## 2011-07-08 NOTE — Telephone Encounter (Signed)
I left message on pts mobile to call office regarding lab test results

## 2011-07-09 ENCOUNTER — Telehealth: Payer: Self-pay | Admitting: Internal Medicine

## 2011-07-09 ENCOUNTER — Encounter: Payer: Self-pay | Admitting: Emergency Medicine

## 2011-07-09 NOTE — Telephone Encounter (Signed)
Spoke with pt. And informed of PTH and calcium.  Will set up referral with endocrinology at our next visit.  Advised not to take Calcium and vit D .  Suspect increased calcium from sarcoidosis

## 2011-07-15 ENCOUNTER — Other Ambulatory Visit: Payer: Managed Care, Other (non HMO)

## 2011-07-16 LAB — COMPREHENSIVE METABOLIC PANEL
Albumin: 4.6 g/dL (ref 3.5–5.2)
CO2: 26 mEq/L (ref 19–32)
Calcium: 10.4 mg/dL (ref 8.4–10.5)
Glucose, Bld: 73 mg/dL (ref 70–99)
Potassium: 4.8 mEq/L (ref 3.5–5.3)
Sodium: 141 mEq/L (ref 135–145)
Total Protein: 7 g/dL (ref 6.0–8.3)

## 2011-07-17 ENCOUNTER — Encounter: Payer: Self-pay | Admitting: Internal Medicine

## 2011-07-17 ENCOUNTER — Ambulatory Visit (INDEPENDENT_AMBULATORY_CARE_PROVIDER_SITE_OTHER): Payer: Managed Care, Other (non HMO) | Admitting: Internal Medicine

## 2011-07-17 VITALS — BP 110/68 | HR 65 | Temp 97.0°F | Resp 12 | Ht 69.5 in | Wt 196.0 lb

## 2011-07-17 DIAGNOSIS — E785 Hyperlipidemia, unspecified: Secondary | ICD-10-CM

## 2011-07-17 DIAGNOSIS — M129 Arthropathy, unspecified: Secondary | ICD-10-CM

## 2011-07-17 DIAGNOSIS — R894 Abnormal immunological findings in specimens from other organs, systems and tissues: Secondary | ICD-10-CM

## 2011-07-17 DIAGNOSIS — R209 Unspecified disturbances of skin sensation: Secondary | ICD-10-CM

## 2011-07-17 DIAGNOSIS — R768 Other specified abnormal immunological findings in serum: Secondary | ICD-10-CM

## 2011-07-17 DIAGNOSIS — R7989 Other specified abnormal findings of blood chemistry: Secondary | ICD-10-CM

## 2011-07-17 DIAGNOSIS — I10 Essential (primary) hypertension: Secondary | ICD-10-CM

## 2011-07-17 DIAGNOSIS — R799 Abnormal finding of blood chemistry, unspecified: Secondary | ICD-10-CM

## 2011-07-17 DIAGNOSIS — M1990 Unspecified osteoarthritis, unspecified site: Secondary | ICD-10-CM

## 2011-07-17 DIAGNOSIS — D869 Sarcoidosis, unspecified: Secondary | ICD-10-CM

## 2011-07-17 DIAGNOSIS — Z23 Encounter for immunization: Secondary | ICD-10-CM

## 2011-07-17 DIAGNOSIS — E039 Hypothyroidism, unspecified: Secondary | ICD-10-CM

## 2011-07-17 MED ORDER — TETANUS-DIPHTH-ACELL PERTUSSIS 5-2.5-18.5 LF-MCG/0.5 IM SUSP: 0.5000 mL | Freq: Once | INTRAMUSCULAR | Status: DC

## 2011-07-17 NOTE — Progress Notes (Signed)
Subjective:    Patient ID: Shannon Wright, female    DOB: 07/23/1956, 55 y.o.   MRN: [id number]  HPI  Shannon Wright is here for a comprhensive evaluation.    She has several concerns.  See labs.  Total calcium has decreased to 10.2 ( from 11.8 with PTH < 2.5)   with minimally elevated ionized calcium levels.  See creatinine.  Creatinine now 1.2  Last level 1.05 done on 10/05 by an integrative NP that pt was seeing in October.   Pt is hesitant to take prednisone as she did not like side effects when taking steroids when she was first diagnosed.  She reports reddened eyes since our last visit and has long standing discomfort and numbness in her feet. She reports that she did have recent opthalmologic exam.     Paresthesias predominates in medial and lateral margins of bilateral feet.  She is quite concerned if her sarcoidosis is under good control now given eye symptoms and elevated creatinine.  She states she cannot tolerate long term prednisone.  She is UTD on mammogram but I do not have report, had colonoscopy in 2004-2005 in [place]      No Known Allergies Past Medical History  Diagnosis Date  . Sarcoidosis   . Right knee meniscal tear   . ANA positive   . Genital ulcer, female     question Bechets  . Menopause   . Anxiety     chronic  . Arthritis   . Hypertension   . Hyperlipidemia   . Thyroid disease     MNG S/P total lthyroidectomy  . Basal cell carcinoma of skin    Past Surgical History  Procedure Date  . Total thyroidectomy   . Abdominal hysterectomy 2005  . Bilateral salpingoophorectomy 2005   History   Social History  . Marital Status: Married    Spouse Name: Bill    Number of Children: 0  . Years of Education: N/A   Occupational History  . VP    Social History Main Topics  . Smoking status: Former Smoker -- 0.2 packs/day for 10 years    Quit date: 09/22/2004  . Smokeless tobacco: Not on file  . Alcohol Use: 1.2 oz/week    2 Glasses of wine per week  .  Drug Use: No  . Sexually Active: Yes    Birth Control/ Protection: Surgical   Other Topics Concern  . Not on file   Social History Narrative   2 Step DaughtersHobby - scuba diving   Family History  Problem Relation Age of Onset  . Cancer Mother     head and neck  . Other Mother     aortic valve replacement  . Hypertension Mother   . Arthritis Mother   . Osteoporosis Mother   . Other Sister     tachycardia  . Cancer Maternal Grandmother     Colon  . Cancer Maternal Grandfather     Lung   Patient Active Problem List  Diagnoses  . SARCOIDOSIS  . THYROID NODULE, LEFT  . GOITER, MULTINODULAR  . HYPOTHYROIDISM  . GLUCOCORTICOID DEFICIENCY  . PERIORBITAL CELLULITIS  . DYSFUNCTION OF EUSTACHIAN TUBE  . TINNITUS  . HYPERTENSION  . HAND PAIN, RIGHT  . PALPITATIONS, OCCASIONAL  . HYPERGLYCEMIA  . PARESTHESIA  . HOT FLASHES  . Anxiety  . Arthritis  . Hyperlipidemia  . Basal cell carcinoma of skin  . ANA positive  . Menopause   Current Outpatient Prescriptions on File Prior  to Visit  Medication Sig Dispense Refill  . acyclovir (ZOVIRAX) 400 MG tablet Take 1 tablet by mouth Daily.      . bisoprolol (ZEBETA) 5 MG tablet 1/2 tablet by mouth once a day  45 tablet  1  . estrogens, conjugated, (PREMARIN) 0.625 MG tablet Take 0.625 mg by mouth daily. Take daily for 21 days then do not take for 7 days.       Marland Kitchen levothyroxine (SYNTHROID, LEVOTHROID) 125 MCG tablet Take 125 mcg by mouth daily.        Marland Kitchen venlafaxine (EFFEXOR-XR) 150 MG 24 hr capsule Take 150 mg by mouth daily.         No current facility-administered medications on file prior to visit.       Review of Systems See HPI    Objective:   Physical Exam  Physical Exam  Nursing note and vitals reviewed.  Constitutional: She is oriented to person, place, and time. She appears well-developed and well-nourished.  HENT:  Head: Normocephalic and atraumatic.  Right Ear: Tympanic membrane and ear canal normal. No  drainage. Tympanic membrane is not injected and not erythematous.  Left Ear: Tympanic membrane and ear canal normal. No drainage. Tympanic membrane is not injected and not erythematous.  Nose: Nose normal. Right sinus exhibits no maxillary sinus tenderness and no frontal sinus tenderness. Left sinus exhibits no maxillary sinus tenderness and no frontal sinus tenderness.  Mouth/Throat: Oropharynx is clear and moist. No oral lesions. No oropharyngeal exudate.  Eyes: Conjunctivae reddened bilaterally and EOM are normal. Pupils are equal, round, and reactive to light.  Neck: Normal range of motion. Neck supple. No JVD present. Carotid bruit is not present. No mass and no thyromegaly present.  Cardiovascular: Normal rate, regular rhythm, S1 normal, S2 normal and intact distal pulses. Exam reveals no gallop and no friction rub.  No murmur heard.  Pulses:  Carotid pulses are 2+ on the right side, and 2+ on the left side.  Dorsalis pedis pulses are 2+ on the right side, and 2+ on the left side.  No carotid bruit. No LE edema  Pulmonary/Chest: Breath sounds normal. She has no wheezes. She has no rales. She exhibits no tenderness.  Abdominal: Soft. Bowel sounds are normal. She exhibits no distension and no mass. There is no hepatosplenomegaly. There is no tenderness. There is no CVA tenderness.  Musculoskeletal: Normal range of motion.  No active synovitis to joints.  Lymphadenopathy:  She has no cervical adenopathy.  She has no axillary adenopathy.  Right: No inguinal and no supraclavicular adenopathy present.  Left: No inguinal and no supraclavicular adenopathy present.  Neurological: She is alert and oriented to person, place, and time. She has normal strength and normal reflexes. She displays no tremor. No cranial nerve deficit.. Coordination and gait normal. Sensory  She has decreased sensation to lateral margins and first and fifth digits of both feet. Skin: Skin is warm and dry. No rash noted. No  cyanosis. Nails show no clubbing.  Psychiatric: She has a normal mood and affect. Her speech is normal and behavior is normal. Cognition and memory are normal.          Assessment & Plan:  1)  Hypercalcemia:  Mild levels now but clinically likely culprit is sarcoidosis as her  PTH level very low.    She is resistant to steroid therapy but will moniter closely and will try to persuade her to take 1/2 the dose she was initiated on when diagnosed.  She  has upcoming appt. With Dr. Delford Field and she wishes to discuss alternatives to steroid treatment and/or immunosuppressives at that time 2)  Mildly elevated creatinine.  Will schedule renal ultrasound and get urianalysis to assess for active nephritis.  If U/S ok and creatinine continues to elevate despite treatment for sarcoid, may need renal biopsy.  Pt travels frequently and is going out of town early next week,  She willl have Ultrasound in one week 3)  Paresthesias bilateral feet.  Etiology unclear right now but stressed to pt to take one thing at a time and get sarcoid under better control  assess overall response, before trying a lot of new meds 4)  Health maintenance:  She has had her influenza vaccine last visit with me,  Will give Tdap today.  See scanned HM sheet 5) Symptomatic Menopause:   I have asked pt to taper off her compounded hormones and will address HT on later visit. 6) Hypothyroidism:  Free levels normal when checked 8/12 by NP 7)  Hyperlipidemia:  Dash diet given  will recheck fasting levels in future after renal and sarcoid treatment issues clarified  I spent 45 mints with this pt

## 2011-07-20 NOTE — Patient Instructions (Signed)
Will schedule renal ultrasound for you  Drink lots of liquids

## 2011-07-24 ENCOUNTER — Other Ambulatory Visit: Payer: Self-pay | Admitting: Emergency Medicine

## 2011-07-24 ENCOUNTER — Telehealth: Payer: Self-pay | Admitting: *Deleted

## 2011-07-24 DIAGNOSIS — I10 Essential (primary) hypertension: Secondary | ICD-10-CM

## 2011-07-24 NOTE — Telephone Encounter (Signed)
Per Dr. Delford Field, pt needs OV with him next week.  I called pt's home and cell #'s [email or web address] to schedule this.

## 2011-07-24 NOTE — Telephone Encounter (Signed)
Per PW - this appt needs to be asap next week.

## 2011-07-24 NOTE — Telephone Encounter (Signed)
Faxed refill request for 90 day supply of bisoprolol to [facility]

## 2011-07-24 NOTE — Telephone Encounter (Signed)
Pt has appt for 11/8.  [place] Dec asked PW if this date was ok.  He said "yes" & advised pt.  Antionette Fairy

## 2011-07-25 ENCOUNTER — Ambulatory Visit (HOSPITAL_BASED_OUTPATIENT_CLINIC_OR_DEPARTMENT_OTHER)
Admission: RE | Admit: 2011-07-25 | Discharge: 2011-07-25 | Disposition: A | Discharge status: Home / Self Care | Payer: Managed Care, Other (non HMO) | Source: Ambulatory Visit | Attending: Internal Medicine | Admitting: Internal Medicine

## 2011-07-25 DIAGNOSIS — R944 Abnormal results of kidney function studies: Secondary | ICD-10-CM

## 2011-07-25 DIAGNOSIS — K8020 Calculus of gallbladder without cholecystitis without obstruction: Secondary | ICD-10-CM | POA: Insufficient documentation

## 2011-07-25 DIAGNOSIS — R7989 Other specified abnormal findings of blood chemistry: Secondary | ICD-10-CM

## 2011-07-25 MED ORDER — BISOPROLOL FUMARATE 5 MG PO TABS: ORAL_TABLET | ORAL | Status: DC

## 2011-07-28 ENCOUNTER — Encounter: Payer: Self-pay | Admitting: Internal Medicine

## 2011-07-28 ENCOUNTER — Other Ambulatory Visit (INDEPENDENT_AMBULATORY_CARE_PROVIDER_SITE_OTHER): Payer: Managed Care, Other (non HMO)

## 2011-07-28 ENCOUNTER — Telehealth: Payer: Self-pay | Admitting: Emergency Medicine

## 2011-07-28 DIAGNOSIS — K8020 Calculus of gallbladder without cholecystitis without obstruction: Secondary | ICD-10-CM | POA: Insufficient documentation

## 2011-07-28 DIAGNOSIS — R799 Abnormal finding of blood chemistry, unspecified: Secondary | ICD-10-CM

## 2011-07-28 LAB — POCT URINALYSIS DIPSTICK
Leukocytes, UA: NEGATIVE
Nitrite, UA: NEGATIVE
Protein, UA: NEGATIVE

## 2011-07-28 MED ORDER — BISOPROLOL FUMARATE 5 MG PO TABS: ORAL_TABLET | ORAL | Status: DC

## 2011-07-28 NOTE — Telephone Encounter (Signed)
Shannon Wright gave urine specimen on Friday, results should be in your basket.  Okay to call and tell her all was okay on specimen?

## 2011-07-28 NOTE — Telephone Encounter (Signed)
Resent to [facility] home delivery per VO DDS

## 2011-07-29 ENCOUNTER — Institutional Professional Consult (permissible substitution): Payer: Managed Care, Other (non HMO) | Admitting: Internal Medicine

## 2011-07-31 ENCOUNTER — Encounter: Payer: Self-pay | Admitting: Critical Care Medicine

## 2011-07-31 ENCOUNTER — Ambulatory Visit (INDEPENDENT_AMBULATORY_CARE_PROVIDER_SITE_OTHER): Payer: Managed Care, Other (non HMO) | Admitting: Critical Care Medicine

## 2011-07-31 ENCOUNTER — Encounter: Payer: Self-pay | Admitting: Neurology

## 2011-07-31 ENCOUNTER — Ambulatory Visit: Payer: Managed Care, Other (non HMO) | Admitting: Critical Care Medicine

## 2011-07-31 ENCOUNTER — Other Ambulatory Visit: Payer: Self-pay | Admitting: Critical Care Medicine

## 2011-07-31 DIAGNOSIS — D869 Sarcoidosis, unspecified: Secondary | ICD-10-CM

## 2011-07-31 DIAGNOSIS — R209 Unspecified disturbances of skin sensation: Secondary | ICD-10-CM

## 2011-07-31 NOTE — Assessment & Plan Note (Signed)
Hypercalcemia prob d/t sarcoidosis, Low PTH  Plan See sarcoid assessment

## 2011-07-31 NOTE — Assessment & Plan Note (Signed)
Hx of sarcoidosis with diffuse micronodular disease on 2010 CT Chest Hypercalcemia 10/12 prob d/t sarcoidosis , Low PTH  Plan Check 1,25 Vit D level,  Angiotension converting enzyme inhibitor Check Spep

## 2011-07-31 NOTE — Progress Notes (Signed)
Subjective:    Patient ID: Shannon Wright, female    DOB: Dec 10, 1955, 55 y.o.   MRN: [id number]  HPI This is a 55 y.o.  female with sarcoidosis Stage III    CT of Chest:  02/07/2010:  CT of Chest: Findings: No prior CT of the chest is available for comparison. On  the present study multiple small nodules are present which are  primarily perilymphatic in distribution, with upper lobe  predominance. There is some nodularity of the fissures, and there  is interlobular septal thickening present. Slightly more prominent  nodules also are present scattered throughout the lungs with a  nodule adjacent to the right heart border measuring 7 mm in  diameter. These findings are very typical of sarcoidosis in the  early and/or active phase. No bronchiectasis is seen and no  honeycombing is noted. If there is a prior CT the chest , direct  comparison is recommended to assess interval change. No pleural  effusion is seen.  On soft tissue window images there may be small low attenuation  thyroid nodules present. No definite mediastinal or hilar  adenopathy is seen, with a calcified left hilar node present. What  is seen of the upper abdomen is unremarkable. No bony abnormality  is noted.  IMPRESSION:  Multiple small perilymphatic nodules scattered throughout primarily  the upper lobes with some nodularity of the fissures and  interlobular septal thickening consistent with changes of  sarcoidosis. No adenopathy is seen.   12/19/2008:  CT of Chest: Diffuse peribronchovascular and subpleural micronodularity in the upper lobes, No pleural effusions or mediastinal LAN.  innummerable splenic nodules   07/31/2011 Not seen since 3/12. Sarcoid f/u. ? Flare.  Feels poorly.  Blood work showed elevated Ca.  PTH low, Ca 11.8,  Then f/u Ca lower.  ? Is sarcoid active.  No active lung symptoms.  In feet, balls of feet are numb, whether standing or in bed.  ? Nerve issues.  On feet a lot and feels like  walking on marbles.  Found sl issue in the nerve abn  Two years ago. Pulm: no cough, no dypsnea with exertion.  No chest pain.  No real wheeze.    Past Medical History  Diagnosis Date  . Sarcoidosis   . Right knee meniscal tear   . ANA positive   . Genital ulcer, female     question Bechets  . Menopause   . Anxiety     chronic  . Arthritis   . Hypertension   . Hyperlipidemia   . Thyroid disease     MNG S/P total lthyroidectomy  . Basal cell carcinoma of skin   . Hypercalcemia      Family History  Problem Relation Age of Onset  . Cancer Mother     head and neck  . Other Mother     aortic valve replacement  . Hypertension Mother   . Arthritis Mother   . Osteoporosis Mother   . Other Sister     tachycardia  . Cancer Maternal Grandmother     Colon  . Cancer Maternal Grandfather     Lung     History   Social History  . Marital Status: Married    Spouse Name: Bill    Number of Children: 0  . Years of Education: N/A   Occupational History  . VP    Social History Main Topics  . Smoking status: Former Smoker -- 0.2 packs/day for 10 years    Quit date:  09/22/2004  . Smokeless tobacco: Not on file  . Alcohol Use: 1.2 oz/week    2 Glasses of wine per week  . Drug Use: No  . Sexually Active: Yes    Birth Control/ Protection: Surgical   Other Topics Concern  . Not on file   Social History Narrative   2 Step DaughtersHobby - scuba diving     No Known Allergies   Outpatient Prescriptions Prior to Visit  Medication Sig Dispense Refill  . acyclovir (ZOVIRAX) 400 MG tablet Take 1 tablet by mouth Daily.      . bisoprolol (ZEBETA) 5 MG tablet 1/2 tablet by mouth once a day  45 tablet  3  . levothyroxine (SYNTHROID, LEVOTHROID) 125 MCG tablet Take 125 mcg by mouth daily.        Marland Kitchen estrogens, conjugated, (PREMARIN) 0.625 MG tablet Take 0.625 mg by mouth daily. Take daily for 21 days then do not take for 7 days.       Marland Kitchen venlafaxine (EFFEXOR-XR) 150 MG 24 hr capsule  Take 150 mg by mouth daily.         Facility-Administered Medications Prior to Visit  Medication Dose Route Frequency Provider Last Rate Last Dose  . TDaP (BOOSTRIX) injection 0.5 mL  0.5 mL Intramuscular Once Levon Hedger, MD          Review of Systems Constitutional:   No  weight loss, night sweats,  Fevers, chills, fatigue, lassitude. HEENT:   No headaches,  Difficulty swallowing,  Tooth/dental problems,  Sore throat,                No sneezing, itching, ear ache, nasal congestion, post nasal drip,   CV:  No chest pain,  Orthopnea, PND, swelling in lower extremities, anasarca, dizziness, palpitations  GI  Notes some  heartburn, indigestion, abdominal pain, nausea, vomiting, diarrhea, change in bowel habits, loss of appetite  Resp: No shortness of breath with exertion or at rest.  No excess mucus, no productive cough,  No non-productive cough,  No coughing up of blood.  No change in color of mucus.  No wheezing.  No chest wall deformity  Skin: no rash or lesions.  GU: no dysuria, change in color of urine, no urgency or frequency.  No flank pain.  MS:  No joint pain or swelling.  No decreased range of motion.  No back pain.  Neuro:  Numb feet.  Psych:  No change in mood or affect. No depression or anxiety.  No memory loss.     Objective:   Physical Exam  Filed Vitals:   07/31/11 0943  BP: 120/90  Pulse: 75  Temp: 98 F (36.7 C)  TempSrc: Oral  Height: 5\' 11"  (1.803 m)  Weight: 198 lb (89.812 kg)  SpO2: 98%    Gen: Pleasant, well-nourished, in no distress,  normal affect  ENT: No lesions,  mouth clear,  oropharynx clear, no postnasal drip  Neck: No JVD, no TMG, no carotid bruits  Lungs: No use of accessory muscles, no dullness to percussion, clear without rales or rhonchi  Cardiovascular: RRR, heart sounds normal, no murmur or gallops, no peripheral edema  Abdomen: soft and NT, no HSM,  BS normal  Musculoskeletal: No deformities, no cyanosis or  clubbing  Neuro: alert, non focal, numbness at base of feet   Skin: Warm, no lesions or rashes  07/31/11 Cleda Daub:  normal     Assessment & Plan:   Sarcoidosis Hx of sarcoidosis with diffuse micronodular disease on  2010 CT Chest Hypercalcemia 10/12 prob d/t sarcoidosis , Low PTH  Plan Check 1,25 Vit D level,  Angiotension converting enzyme inhibitor Check Spep   Hypercalcemia Hypercalcemia prob d/t sarcoidosis, Low PTH  Plan See sarcoid assessment  PARESTHESIA Paresthesias  ?neuropathy  ? If related to hypercalcemia. Plan Neurology Eval     Updated Medication List Outpatient Encounter Prescriptions as of 07/31/2011  Medication Sig Dispense Refill  . acyclovir (ZOVIRAX) 400 MG tablet Take 1 tablet by mouth Daily.      . bisoprolol (ZEBETA) 5 MG tablet 1/2 tablet by mouth once a day  45 tablet  3  . levothyroxine (SYNTHROID, LEVOTHROID) 125 MCG tablet Take 125 mcg by mouth daily.        Marland Kitchen DISCONTD: estrogens, conjugated, (PREMARIN) 0.625 MG tablet Take 0.625 mg by mouth daily. Take daily for 21 days then do not take for 7 days.       Marland Kitchen DISCONTD: venlafaxine (EFFEXOR-XR) 150 MG 24 hr capsule Take 150 mg by mouth daily.         Facility-Administered Encounter Medications as of 07/31/2011  Medication Dose Route Frequency Provider Last Rate Last Dose  . DISCONTD: TDaP (BOOSTRIX) injection 0.5 mL  0.5 mL Intramuscular Once Levon Hedger, MD

## 2011-07-31 NOTE — Assessment & Plan Note (Signed)
Paresthesias  ?neuropathy  ? If related to hypercalcemia. Plan Neurology Eval

## 2011-07-31 NOTE — Patient Instructions (Signed)
CT Chest will be obtained More labs today: vit D level, SPEP, ACE  Referral to Neurology will be made I will call with results Return 1 month

## 2011-08-03 LAB — VITAMIN D 1,25 DIHYDROXY: Vitamin D3 1, 25 (OH)2: 65 pg/mL

## 2011-08-04 LAB — PROTEIN ELECTROPHORESIS, SERUM
Beta Globulin: 5.7 % (ref 4.7–7.2)
Total Protein, Serum Electrophoresis: 7.3 g/dL (ref 6.0–8.3)

## 2011-08-05 ENCOUNTER — Ambulatory Visit
Admission: RE | Admit: 2011-08-05 | Discharge: 2011-08-05 | Disposition: A | Discharge status: Home / Self Care | Payer: Managed Care, Other (non HMO) | Source: Ambulatory Visit | Attending: Critical Care Medicine | Admitting: Critical Care Medicine

## 2011-08-05 DIAGNOSIS — D869 Sarcoidosis, unspecified: Secondary | ICD-10-CM

## 2011-08-06 ENCOUNTER — Encounter: Payer: Self-pay | Admitting: Internal Medicine

## 2011-08-06 ENCOUNTER — Ambulatory Visit (INDEPENDENT_AMBULATORY_CARE_PROVIDER_SITE_OTHER): Payer: Managed Care, Other (non HMO) | Admitting: Internal Medicine

## 2011-08-06 VITALS — BP 120/76 | HR 72 | Temp 97.3°F | Resp 12 | Ht 69.5 in | Wt 197.0 lb

## 2011-08-06 DIAGNOSIS — D869 Sarcoidosis, unspecified: Secondary | ICD-10-CM

## 2011-08-06 DIAGNOSIS — I10 Essential (primary) hypertension: Secondary | ICD-10-CM

## 2011-08-06 DIAGNOSIS — N951 Menopausal and female climacteric states: Secondary | ICD-10-CM

## 2011-08-06 DIAGNOSIS — E039 Hypothyroidism, unspecified: Secondary | ICD-10-CM

## 2011-08-06 LAB — COMPREHENSIVE METABOLIC PANEL
Albumin: 4.8 g/dL (ref 3.5–5.2)
CO2: 29 mEq/L (ref 19–32)
Calcium: 10.4 mg/dL (ref 8.4–10.5)
Glucose, Bld: 98 mg/dL (ref 70–99)
Potassium: 4.6 mEq/L (ref 3.5–5.3)
Sodium: 141 mEq/L (ref 135–145)
Total Protein: 7.1 g/dL (ref 6.0–8.3)

## 2011-08-06 NOTE — Progress Notes (Signed)
Subjective:    Patient ID: Shannon Wright, female    DOB: 02/16/1956, 55 y.o.   MRN: [id number]  HPI  Shannon Wright is her for follow up.   Overall she feels better, she is smiling more today.  She has seen her pulmonologist and her recent CT reveals smaller pumonary nodularity.  She is concerned over calcium although last check this was also decreasing.    She still has occasional hot flushes, worse at night.  She was on several estorgen products in the past but she took herself off of them.  She does not like to take meds.  Has some pain with intercourse due to dryness.  She still has numbness and a "ball'  Feeling in both feet and she tells me she has an upcoming appt with neurologist Dr. Modesto Charon.  She does worry about sarcoid in her liver and spleen as this was found apparently when she was first diagnosed  She has incidental gallstones. Does report one incident of RUQ pain in distant past  No Known Allergies Past Medical History  Diagnosis Date  . Sarcoidosis   . Right knee meniscal tear   . ANA positive   . Genital ulcer, female     question Bechets  . Menopause   . Anxiety     chronic  . Arthritis   . Hypertension   . Hyperlipidemia   . Thyroid disease     MNG S/P total lthyroidectomy  . Basal cell carcinoma of skin   . Hypercalcemia    Past Surgical History  Procedure Date  . Total thyroidectomy   . Abdominal hysterectomy 2005  . Bilateral salpingoophorectomy 2005   History   Social History  . Marital Status: Married    Spouse Name: Bill    Number of Children: 0  . Years of Education: N/A   Occupational History  . VP    Social History Main Topics  . Smoking status: Former Smoker -- 0.2 packs/day for 10 years    Quit date: 09/22/2004  . Smokeless tobacco: Not on file  . Alcohol Use: 1.2 oz/week    2 Glasses of wine per week  . Drug Use: No  . Sexually Active: Yes    Birth Control/ Protection: Surgical   Other Topics Concern  . Not on file   Social  History Narrative   2 Step DaughtersHobby - scuba diving   Family History  Problem Relation Age of Onset  . Cancer Mother     head and neck  . Other Mother     aortic valve replacement  . Hypertension Mother   . Arthritis Mother   . Osteoporosis Mother   . Other Sister     tachycardia  . Cancer Maternal Grandmother     Colon  . Cancer Maternal Grandfather     Lung   Patient Active Problem List  Diagnoses  . Sarcoidosis  . THYROID NODULE, LEFT  . GOITER, MULTINODULAR  . HYPOTHYROIDISM  . GLUCOCORTICOID DEFICIENCY  . PERIORBITAL CELLULITIS  . DYSFUNCTION OF EUSTACHIAN TUBE  . TINNITUS  . HYPERTENSION  . HAND PAIN, RIGHT  . PALPITATIONS, OCCASIONAL  . HYPERGLYCEMIA  . PARESTHESIA  . HOT FLASHES  . Anxiety  . Arthritis  . Hyperlipidemia  . Basal cell carcinoma of skin  . ANA positive  . Menopause  . Gallstones  . Hypercalcemia   Current Outpatient Prescriptions on File Prior to Visit  Medication Sig Dispense Refill  . acyclovir (ZOVIRAX) 400 MG tablet Take 1  tablet by mouth Daily.      . bisoprolol (ZEBETA) 5 MG tablet 1/2 tablet by mouth once a day  45 tablet  3  . levothyroxine (SYNTHROID, LEVOTHROID) 125 MCG tablet Take 125 mcg by mouth daily.            Review of Systems See HPI    Objective:   Physical Exam  Physical Exam  Nursing note and vitals reviewed.  Constitutional: She is oriented to person, place, and time. She appears well-developed and well-nourished.  HENT:  Head: Normocephalic and atraumatic.  Cardiovascular: Normal rate and regular rhythm. Exam reveals no gallop and no friction rub.  No murmur heard.  Pulmonary/Chest: Breath sounds normal. She has no wheezes. She has no rales.  Neurological: She is alert and oriented to person, place, and time.  Skin: Skin is warm and dry.  Psychiatric: She has a normal mood and affect. Her behavior is normal.  Ext:  Good strong pedal pulse bilaterally  NO LE or foot edema      Assessment &  Plan:  1)  Hypercalcemia  Will check today.   2)  Sarcoidosis:  Radiographic improvement  Ace level apparantly lab did not receive . Will recheck ACE  today 3)  Menopausal vasomotor flushes:  She does not wish  meds at this point.  I did recommend OTC Luvenia lubricant for vaginal dryness 4)  Incidental choledocholitihiasis 5)  HTN  Well controlled 6)  HypothyroidisM;  Well controlled  See complete problem list

## 2011-08-06 NOTE — Patient Instructions (Signed)
Labs will be mailed to you 

## 2011-08-07 ENCOUNTER — Encounter: Payer: Self-pay | Admitting: Emergency Medicine

## 2011-08-07 IMAGING — US US BIOPSY
1 series · 5 of 5 positions shown · non-contrast
Comparison: US performed 01/02/10 which revealed a dominant left
thyroid nodule measuring up to 1.4 cm.
COMPARISON: None.

CLINICAL DATA: Incidental finding of thyroid nodule on CT scan.

ULTRASOUND-GUIDED NEEDLE ASPIRATE BIOPSY, LEFT LOBE OF THYROID
The above procedure was discussed with the patient and written
informed consent was obtained.

[Series 1: us biopsy · 5 acquisitions, 5 frames shown]
[im 1/5]
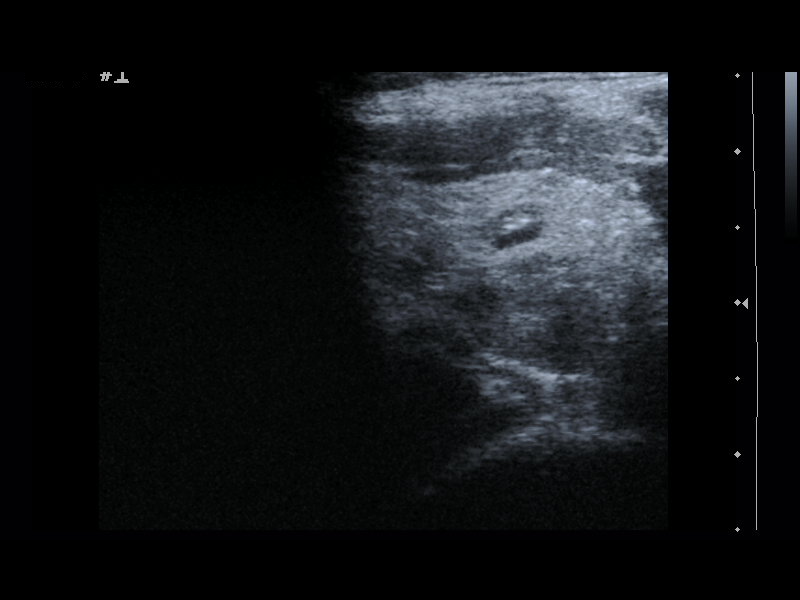
[im 2/5]
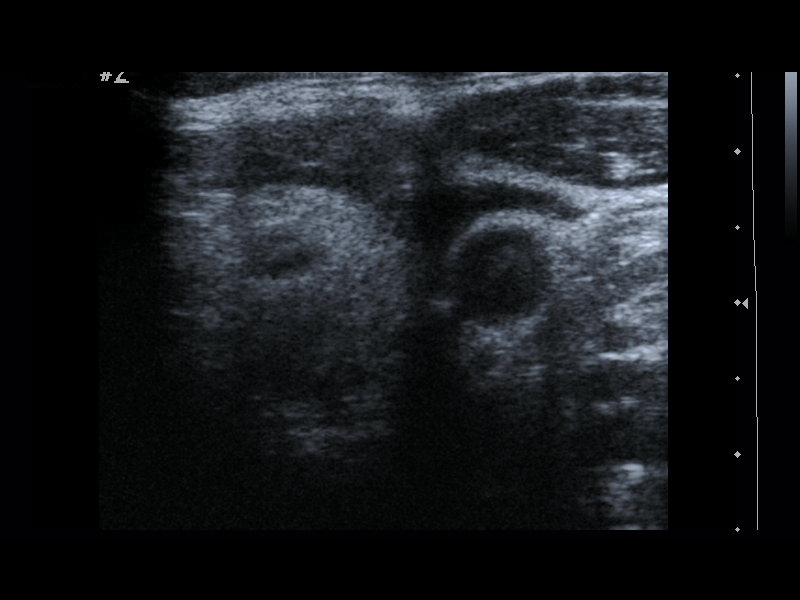
[im 3/5]
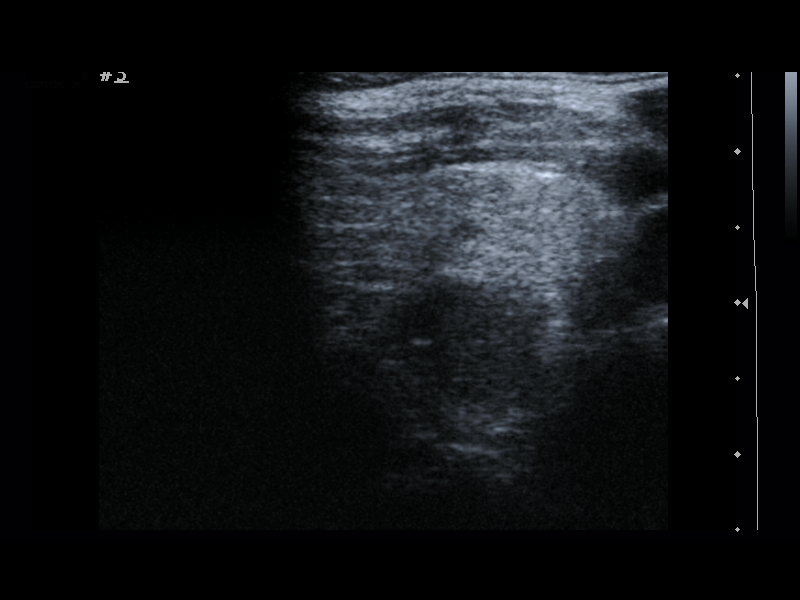
[im 4/5]
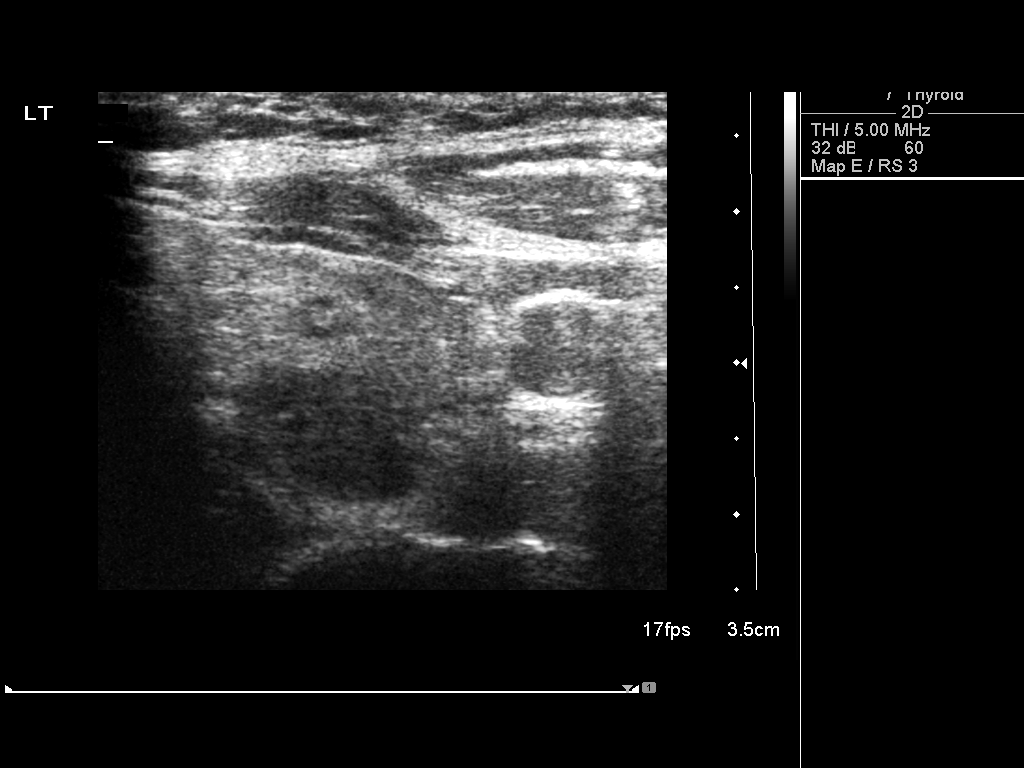
[im 5/5]
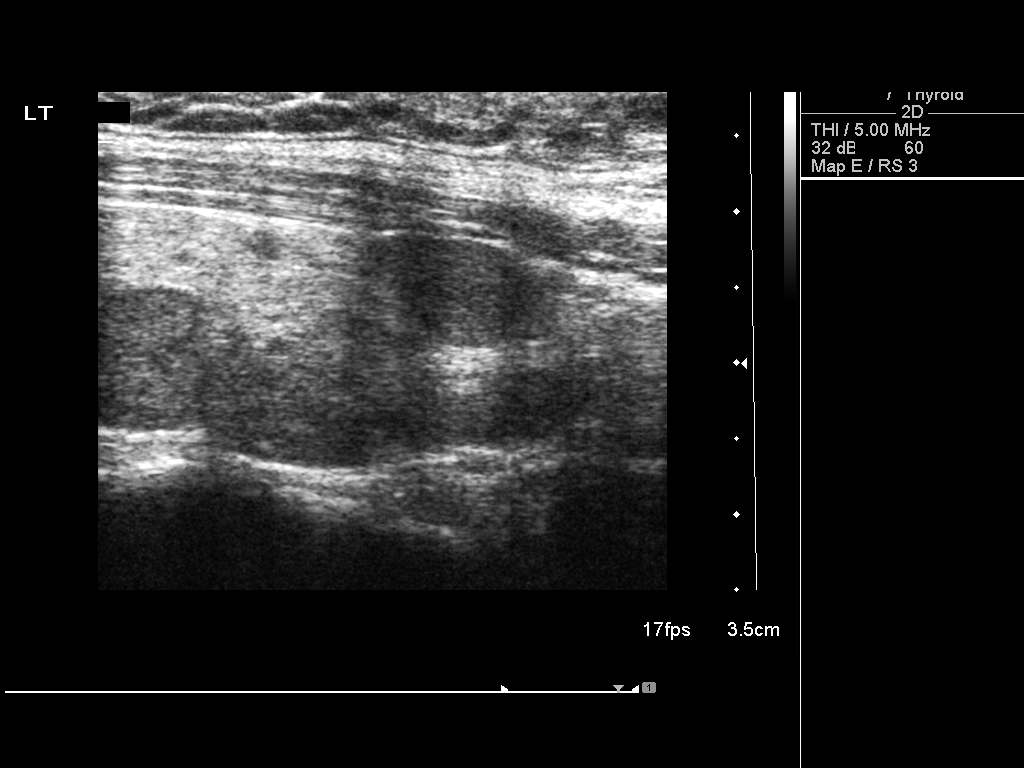

[5 of 5 positions shown; findings below may reference images not displayed]

FINDINGS: Ultrasound was performed to localize and mark an adequate
site for the biopsy.  The patient was then prepped and draped in a
normal sterile fashion.  Local anesthesia was provided with 1%
lidocaine.  Using direct ultrasound guidance, three passes were
made using 25 gauge needles into the nodule within the left lobe of
the thyroid.  Ultrasound was used to confirm needle placements on
all occasions.  Specimens were sent to Pathology for analysis.
Post procedural imaging demonstrated no hematoma or immediate
complication.  The patient tolerated the procedure well.
IMPRESSION: Successful fine needle aspirate of dominant left thyroid nodule as
described above.

Read by: Napoleon, Kutin.-FLETES

ULTRASOUND-GUIDED NEEDLE ASPIRATE BIOPSY,  LOBE OF THYROID

The above procedure was discussed with the patient and written
informed consent was obtained.
FINDINGS: Ultrasound was performed to localize and mark an adequate
site for the biopsy.  The patient was then prepped and draped in a
normal sterile fashion.  Local anesthesia was provided with 1%
lidocaine.  Using direct ultrasound guidance,  passes were made
using  needles into the nodule within the  lobe of the thyroid.
Ultrasound was used to confirm needle placements on all occasions.
Specimens were sent to Pathology for analysis.  Post procedural
imaging demonstrated no hematoma or immediate complication.  The
patient tolerated the procedure well.
IMPRESSION:

## 2011-08-29 ENCOUNTER — Ambulatory Visit: Payer: Managed Care, Other (non HMO) | Admitting: Neurology

## 2011-09-01 ENCOUNTER — Encounter: Payer: Self-pay | Admitting: Neurology

## 2011-09-01 ENCOUNTER — Other Ambulatory Visit: Payer: Self-pay | Admitting: Neurology

## 2011-09-01 ENCOUNTER — Other Ambulatory Visit (INDEPENDENT_AMBULATORY_CARE_PROVIDER_SITE_OTHER): Payer: Managed Care, Other (non HMO)

## 2011-09-01 ENCOUNTER — Ambulatory Visit (INDEPENDENT_AMBULATORY_CARE_PROVIDER_SITE_OTHER): Payer: Managed Care, Other (non HMO) | Admitting: Neurology

## 2011-09-01 VITALS — BP 122/78 | HR 80 | Ht 70.0 in | Wt 199.0 lb

## 2011-09-01 DIAGNOSIS — G609 Hereditary and idiopathic neuropathy, unspecified: Secondary | ICD-10-CM

## 2011-09-01 LAB — CBC WITH DIFFERENTIAL/PLATELET
Basophils Relative: 0.8 % (ref 0.0–3.0)
Eosinophils Relative: 8.3 % — ABNORMAL HIGH (ref 0.0–5.0)
HCT: 38.1 % (ref 36.0–46.0)
Hemoglobin: 13.3 g/dL (ref 12.0–15.0)
Lymphs Abs: 1.2 10*3/uL (ref 0.7–4.0)
MCV: 88 fl (ref 78.0–100.0)
Monocytes Absolute: 0.6 10*3/uL (ref 0.1–1.0)
Monocytes Relative: 11.2 % (ref 3.0–12.0)
Neutro Abs: 2.9 10*3/uL (ref 1.4–7.7)
Platelets: 234 10*3/uL (ref 150.0–400.0)
RBC: 4.33 Mil/uL (ref 3.87–5.11)
WBC: 5.1 10*3/uL (ref 4.5–10.5)

## 2011-09-01 LAB — COMPREHENSIVE METABOLIC PANEL
Albumin: 4.7 g/dL (ref 3.5–5.2)
Alkaline Phosphatase: 77 U/L (ref 39–117)
CO2: 27 mEq/L (ref 19–32)
Glucose, Bld: 92 mg/dL (ref 70–99)
Potassium: 4 mEq/L (ref 3.5–5.1)
Sodium: 139 mEq/L (ref 135–145)
Total Protein: 7.6 g/dL (ref 6.0–8.3)

## 2011-09-01 NOTE — Progress Notes (Signed)
Dear Dr. Constance Goltz,  Thank you for having me see Shannon Wright in consultation today at [facility] for her problem with pain in her feet and numbness in her hands.  As you may recall, she is a 55 y.o. year old female with a history of pulmonary and splenic sarcoidosis diagnosed in 2010. Around the same time she began having discomfort in her feet. She characterizes this as feeling like she's walking on marbles or that her toes have "sweaters on them". She endorses both a painful sensation particularly at night characterized as "burning". She also gets a lack of sensation and a feeling of cramping. She just and gets numbness and paresthesias in her hands.  These symptoms have impacted her life significantly as it is very difficult for her to be on her feet. She has only used meloxicam as treatment.  The pain does tend to be worse at night.  With respect to her medical history her sarcoidosis was found incidentally on a chest x-ray in 2010. She was initially treated with high-dose steroids. Her PFTs have been always normal. About 4 months ago she is found to have a slightly elevated calcium and there is been a question of whether her sarcoidosis is more active. However Dr. Danise Mina reimaged and the patient and did not think the pulmonary sarcoid had changed.  She also has a history of vulvar sores and oral sores that in the past was termed Bechets. However she is never had any eye involvement. She's also had 2 bouts of aseptic meningitis in the past. She takes regular acyclovir for what is thought to be a herpetic rash she gets on her left arm and is intermittent.  She also has a positive ANA and has had this worked up at [facility] with no known cause.  Past Medical History  Diagnosis Date  . Sarcoidosis   . Right knee meniscal tear   . ANA positive   . Genital ulcer, female     question Bechets  . Menopause   . Anxiety     chronic  . Arthritis   . Hypertension   .  Hyperlipidemia   . Thyroid disease     MNG S/P total lthyroidectomy  . Basal cell carcinoma of skin   . Hypercalcemia     Past Surgical History  Procedure Date  . Total thyroidectomy   . Abdominal hysterectomy 2005  . Bilateral salpingoophorectomy 2005    History   Social History  . Marital Status: Married    Spouse Name: Bill    Number of Children: 0  . Years of Education: N/A   Occupational History  . VP    Social History Main Topics  . Smoking status: Former Smoker -- 0.2 packs/day for 10 years    Quit date: 09/22/2004  . Smokeless tobacco: Never Used  . Alcohol Use: 1.2 oz/week    2 Glasses of wine per week  . Drug Use: No  . Sexually Active: Yes    Birth Control/ Protection: Surgical   Other Topics Concern  . None   Social History Narrative   2 Step DaughtersHobby - scuba diving    Family History  Problem Relation Age of Onset  . Cancer Mother     head and neck  . Other Mother     aortic valve replacement  . Hypertension Mother   . Arthritis Mother   . Osteoporosis Mother   . Other Sister     tachycardia  . Cancer Maternal Grandmother  Colon  . Cancer Maternal Grandfather     Lung  - no history of neuropathies or high arches.  Current Outpatient Prescriptions on File Prior to Visit  Medication Sig Dispense Refill  . acyclovir (ZOVIRAX) 400 MG tablet Take 1 tablet by mouth Daily.      . bisoprolol (ZEBETA) 5 MG tablet 1/2 tablet by mouth once a day  45 tablet  3  . levothyroxine (SYNTHROID, LEVOTHROID) 125 MCG tablet Take 125 mcg by mouth daily.          No Known Allergies    ROS:  13 systems were reviewed and are notable for an absence of headaches.  She does have arthritis and suffers from problems with memory as well as double or blurred vision at times.  She also gets swelling in the hands and feet.  All other review of systems are unremarkable.   Examination:  Filed Vitals:   09/01/11 0928  BP: 122/78  Pulse: 80  Height: 5\' 10"   (1.778 m)  Weight: 199 lb (90.266 kg)     In general, very well appearing older woman.  Cardiovascular: The patient has a regular rate and rhythm and no carotid bruits.  Fundoscopy:  Disks are flat. Vessel caliber within normal limits.  Mental status:   The patient is oriented to person, place and time. Recent and remote memory are intact. Attention span and concentration are normal. Language including repetition, naming, following commands are intact. Fund of knowledge of current and historical events, as well as vocabulary are normal.  Cranial Nerves: Pupils are equally round and reactive to light. Visual fields full to confrontation. Extraocular movements are intact without nystagmus. Facial sensation and muscles of mastication are intact. Muscles of facial expression are symmetric. Hearing intact to bilateral finger rub. Tongue protrusion, uvula, palate midline.  Shoulder shrug intact  Motor:  The patient has normal bulk and tone, no pronator drift.  There are no adventitious movements.  5/5 bilaterally.  Reflexes:   Biceps  Triceps Brachioradialis Knee Ankle  Right 2+  2+  2+   2+ 2+  Left  2+  2+  2+   2+ 2+  Toes down  Coordination:  Normal finger to nose.  No dysdiadokinesia.  Sensation is reduced in the feet to temperature, normalizing just above the ankle on the right and mid calf on the left.  Vibration sense is slightly reduced in the feet.  Temperature is also decreased in the bilateral arms to mid forearm.  Position sense is normal.  Gait and Station are normal.  Tandem gait is intact.  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  I am going to look for reversible causes today, but indeed this could be secondary to her sarcoidosis.  I am going to get an EMG/NCS which will likely show axonal pathology.  It also may be normal, as sarcoid neuropathy can be small fiber only.  There is some anecdotal evidence that IVIG can help  small fibre sarcoid neuropathy, but for now I think I would just treat this symptomatically likely with a tricyclic anti-depressant as the first agent.  I don't find any evidence of involvement of other parts of her neuraxis by sarcoid at this time.  I am also going to get the records from [facility] as she apparently had an NCS done 2 years ago, as well as the records from her rheumatologist.   We will see the patient back after her EMG/NCS.  Thank you  for having [place] see Shannon Wright in consultation.  Feel free to contact me with any questions.  Lupita Raider Modesto Charon, MD [facility], [place] [address] [place], [place] [address] Phone: [phone] Fax: [phone].

## 2011-09-01 NOTE — Patient Instructions (Signed)
Go to the basement to have your labs drawn today.  Your appointment for the nerve conduction studies/electromyelogram is scheduled for Monday, December 17th at 10:15am at [facility] [address] [place], [place]  [phone]

## 2011-09-02 LAB — C-REACTIVE PROTEIN: CRP: 0.41 mg/dL (ref <0.60)

## 2011-09-02 LAB — SEDIMENTATION RATE: Sed Rate: 20 mm/hr (ref 0–22)

## 2011-09-03 LAB — IMMUNOFIXATION ELECTROPHORESIS
IgA: 127 mg/dL (ref 69–380)
IgG (Immunoglobin G), Serum: 905 mg/dL (ref 690–1700)

## 2011-09-03 LAB — COPPER, SERUM: Copper: 135 ug/dL (ref 70–175)

## 2011-09-04 ENCOUNTER — Ambulatory Visit: Payer: Managed Care, Other (non HMO) | Admitting: Critical Care Medicine

## 2011-09-04 LAB — VITAMIN E: Vitamin E (Alpha Tocopherol): 25.7 mg/L — ABNORMAL HIGH (ref 5.7–19.9)

## 2011-09-25 ENCOUNTER — Ambulatory Visit (INDEPENDENT_AMBULATORY_CARE_PROVIDER_SITE_OTHER): Payer: Managed Care, Other (non HMO) | Admitting: Neurology

## 2011-09-25 ENCOUNTER — Encounter: Payer: Self-pay | Admitting: Neurology

## 2011-09-25 VITALS — BP 122/80 | HR 72 | Ht 70.0 in | Wt 204.0 lb

## 2011-09-25 DIAGNOSIS — G63 Polyneuropathy in diseases classified elsewhere: Secondary | ICD-10-CM

## 2011-09-25 DIAGNOSIS — D869 Sarcoidosis, unspecified: Secondary | ICD-10-CM

## 2011-09-25 DIAGNOSIS — D8689 Sarcoidosis of other sites: Secondary | ICD-10-CM

## 2011-09-25 MED ORDER — AMITRIPTYLINE HCL 25 MG PO TABS: ORAL_TABLET | ORAL | Status: DC

## 2011-09-25 NOTE — Progress Notes (Signed)
Dear Dr. Constance Goltz,  I saw  Shannon Wright back in [facility] for her problem with tingling in her feet and hands and the feeling of "a role of coins" under her feet.  As you may recall, she is a 56 y.o. year old female with a history of sarcoidosis, currently off of steroids who I felt had a likely DSPN from her sarcoid.  An extensive PN panel was negative.  EMG/NCS was only remarkable for a chronic left L5 radiculopathy.  There was no sign of peripheral neuropathy.    Medical history, social history, and family history were reviewed and have not changed since the last clinic visit.  Current Outpatient Prescriptions on File Prior to Visit  Medication Sig Dispense Refill  . acyclovir (ZOVIRAX) 400 MG tablet Take 1 tablet by mouth Daily.      . bisoprolol (ZEBETA) 5 MG tablet 1/2 tablet by mouth once a day  45 tablet  3  . levothyroxine (SYNTHROID, LEVOTHROID) 125 MCG tablet Take 125 mcg by mouth daily.          No Known Allergies  ROS:  13 systems were reviewed and are notable for back spasm in the right lower back after her EMG.  She is treating this with a TENS unit.  First had shooting pain, but no just like "a pulled muscle".  All other review of systems are unremarkable.  Exam: . Filed Vitals:   09/25/11 1145  BP: 122/80  Pulse: 72  Height: 5\' 10"  (1.778 m)  Weight: 204 lb (92.534 kg)    In general, well appearing women who looks younger than state age.   Motor:  Normal bulk and tone, no drift and 5/5 muscle strength bilaterally.  Reflexes:  2+ thoughout.  Coordination:  Normal finger to nose  Gait:  Normal gait and station.  Romberg negative.  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  I think I would treat this symptomatically for now, although I am not confident we will make an huge improvement in her symptoms with the standard treatments.  I will start her on Elavil 12.5->25->50(if she can tolerate it).  She will call  me with issues.  We will see the patient back in 2 months.  Lupita Raider Modesto Charon, MD [facility], [place]

## 2011-09-29 ENCOUNTER — Encounter: Payer: Self-pay | Admitting: Neurology

## 2011-09-29 ENCOUNTER — Telehealth: Payer: Self-pay | Admitting: Neurology

## 2011-09-29 NOTE — Telephone Encounter (Signed)
Received copies from Dr. Christen Bame at ,on 09/29/11. Forwarded  26pages to Dr. Shanon Payor review.

## 2011-10-22 ENCOUNTER — Other Ambulatory Visit: Payer: Self-pay | Admitting: Emergency Medicine

## 2011-10-22 DIAGNOSIS — I10 Essential (primary) hypertension: Secondary | ICD-10-CM

## 2011-10-22 MED ORDER — BISOPROLOL FUMARATE 5 MG PO TABS: ORAL_TABLET | ORAL | Status: DC

## 2011-10-22 NOTE — Telephone Encounter (Signed)
Shannon Wright called, requested refills of bisoprolol, 1/2 tab daily.  She requests 90 day supply with 3 refills to [facility]

## 2011-11-02 IMAGING — CR DG CHEST 2V
2 series · 2 of 2 positions shown · non-contrast
Comparison: 04/19/2009

CLINICAL DATA: Preoperative evaluation for multiple thyroid
nodules.  History of sarcoidosis.  Smoking history with cessation 5
years ago.  Hypertension treated medically.

CHEST - 2 VIEW

[w chest pa]
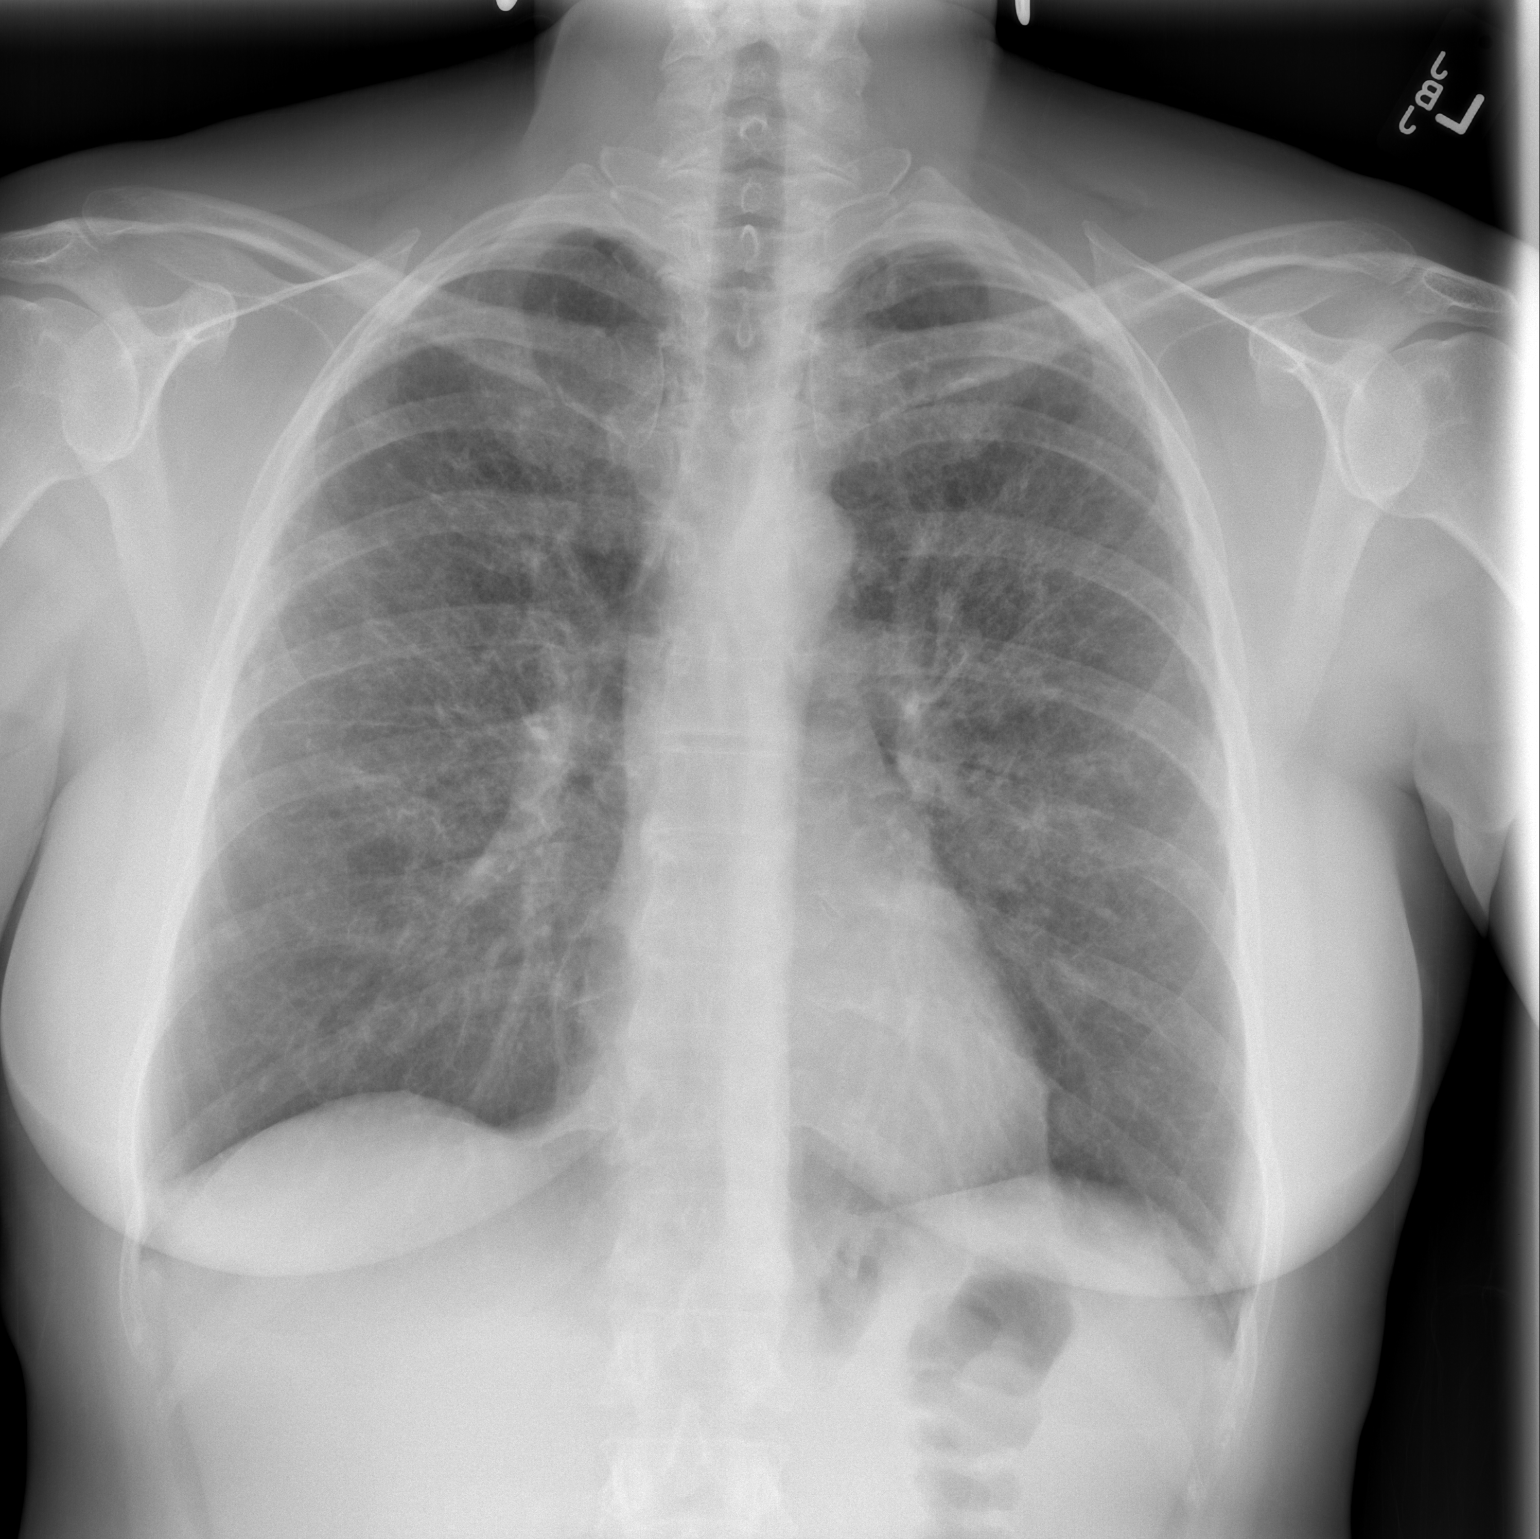

[w chest lat]
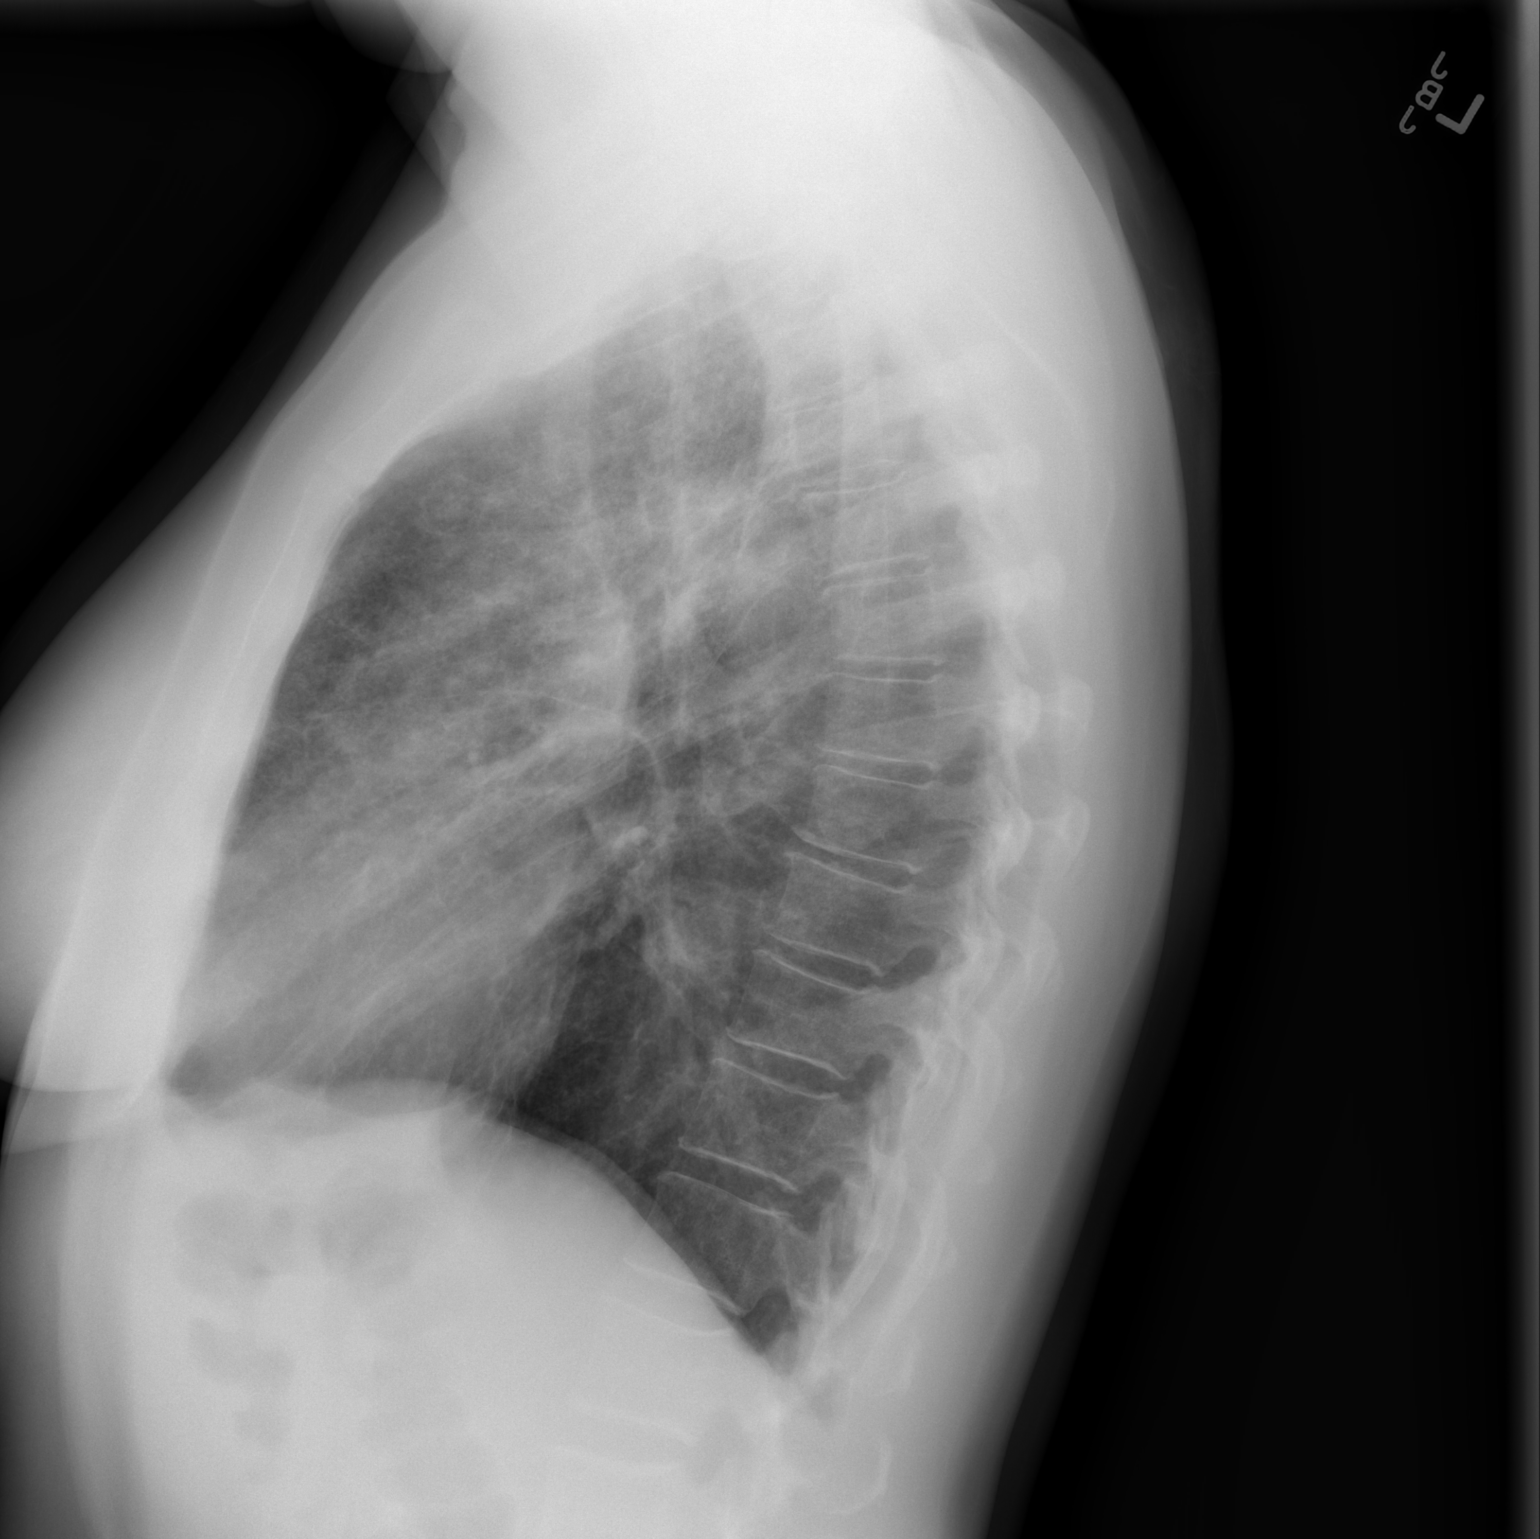

[2 of 2 positions shown; findings below may reference images not displayed]

FINDINGS: Heart and mediastinal contours are within normal limits.
The lung fields demonstrate a coarse interstitial pattern which is
stable in comparison with the prior exam and would correlate with
the patient's history of sarcoidosis.  No new focal infiltrates or
signs of congestive failure seen.  No pleural fluid is noted.  Bony
structures appear intact.
IMPRESSION: Stable chronic changes compatible with the patient's history of
sarcoidosis.  No new focal or acute abnormality seen.

## 2011-12-02 ENCOUNTER — Encounter: Payer: Self-pay | Admitting: Neurology

## 2011-12-02 ENCOUNTER — Ambulatory Visit (INDEPENDENT_AMBULATORY_CARE_PROVIDER_SITE_OTHER): Payer: Managed Care, Other (non HMO) | Admitting: Neurology

## 2011-12-02 VITALS — BP 134/86 | HR 88 | Wt 205.0 lb

## 2011-12-02 DIAGNOSIS — D869 Sarcoidosis, unspecified: Secondary | ICD-10-CM

## 2011-12-02 DIAGNOSIS — D8689 Sarcoidosis of other sites: Secondary | ICD-10-CM

## 2011-12-02 DIAGNOSIS — G63 Polyneuropathy in diseases classified elsewhere: Secondary | ICD-10-CM

## 2011-12-02 MED ORDER — GABAPENTIN 300 MG PO CAPS: ORAL_CAPSULE | ORAL | Status: DC

## 2011-12-02 NOTE — Patient Instructions (Signed)
Gabapentin 300mg  tabs  Take 1 at night for 5 days, then 1 twice a day for 5 days, then 1 three times a day for 5 days, then 1 in am, 1 in the afternoon, 2 at night, for five days then 2 in a.m., 1 in the afternoon, 2 at night., for five days then 2 three times a day.

## 2011-12-02 NOTE — Progress Notes (Signed)
Dear Dr. 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  At her last visit I started her on Elavil 12.5->50mg  qhs.  She felt the 50mg  qhs made her too sleepy, and she is on 37.5mg  qhs.  However, it still causes significant dry mouth and she has not derived significant benefit.  She still gets tingling sensation and pain at the bottom of her feet.  Medical history, social history, and family history were reviewed and have not changed since the last clinic visit.  Current Outpatient Prescriptions on File Prior to Visit  Medication Sig Dispense Refill  . acyclovir (ZOVIRAX) 400 MG tablet Take 1 tablet by mouth Daily.      Marland Kitchen amitriptyline (ELAVIL) 25 MG tablet take 1/2 tab for 1 week at night, then increase to 1 tab at night from then on.  May increase to 2 tabs at night.  60 tablet  3  . bisoprolol (ZEBETA) 5 MG tablet 1/2 tablet by mouth once a day  45 tablet  3  . levothyroxine (SYNTHROID, LEVOTHROID) 125 MCG tablet Take 125 mcg by mouth daily.          No Known Allergies  ROS:  13 systems were reviewed and unremarkable.  Exam: . 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  She is going to stop Elavil.  If it worsens she will go back on it.  In any case I will titrate her to 600 tid of gabapentin to see if this helps.  In the future we may have to consider a trial of a steroid sparing immune suppressant such as Cellcept to see if this helps -- however we will try to treat it symptomatically first.  We will see the patient back in 2 months.  Lupita Raider Modesto Charon, MD [facility], [place]

## 2011-12-30 ENCOUNTER — Encounter: Payer: Self-pay | Admitting: Internal Medicine

## 2011-12-30 ENCOUNTER — Ambulatory Visit (INDEPENDENT_AMBULATORY_CARE_PROVIDER_SITE_OTHER): Payer: Managed Care, Other (non HMO) | Admitting: Internal Medicine

## 2011-12-30 VITALS — BP 122/84 | HR 80 | Temp 98.0°F | Ht 69.5 in | Wt 201.1 lb

## 2011-12-30 DIAGNOSIS — N951 Menopausal and female climacteric states: Secondary | ICD-10-CM

## 2011-12-30 DIAGNOSIS — E039 Hypothyroidism, unspecified: Secondary | ICD-10-CM

## 2011-12-30 DIAGNOSIS — I10 Essential (primary) hypertension: Secondary | ICD-10-CM

## 2011-12-30 MED ORDER — ESTRADIOL 0.05 MG/24HR TD PTTW: MEDICATED_PATCH | TRANSDERMAL | Status: DC

## 2011-12-30 NOTE — Patient Instructions (Signed)
Change patch twice a week  See me in 3 months for BP check  Be sure to get mammogram in September

## 2011-12-30 NOTE — Progress Notes (Signed)
Subjective:    Patient ID: Shannon Wright, female    DOB: 1955/10/02, 56 y.o.   MRN: [id number]  HPI Shannon Wright is here with acute issue.  She reports she has been off HT for about a year and she has noted that her hot flushes have increased to the point of intolerability.  She flushes 10-20 times per day and 6-7 times at night.  "I'm in a pool of water at night".    She was on multiple regimens from  Dr. Altamese Manchester all oral.  She is S/P hysterectomy and bilateral oopherectomy. She reports she had HT for about 5-6 years.  No personal or FH of DVT or PE, no CVA,  Unsure if father had MI in his 47's, no breast cancer,   .  MOther had Aortic valve replacement. Recent mammogram in 05/2011 was negative for malignancy.  She also has seen Dr.  Modesto Charon for her sarcoid neuropathy and states the elavil has helped and she recently started neurontin a few weeks ago.    No Known Allergies Past Medical History  Diagnosis Date  . Sarcoidosis   . Right knee meniscal tear   . ANA positive   . Genital ulcer, female     question Bechets  . Menopause   . Anxiety     chronic  . Arthritis   . Hypertension   . Hyperlipidemia   . Thyroid disease     MNG S/P total lthyroidectomy  . Basal cell carcinoma of skin   . Hypercalcemia    Past Surgical History  Procedure Date  . Total thyroidectomy   . Abdominal hysterectomy 2005  . Bilateral salpingoophorectomy 2005   History   Social History  . Marital Status: Married    Spouse Name: Bill    Number of Children: 0  . Years of Education: N/A   Occupational History  . VP    Social History Main Topics  . Smoking status: Former Smoker -- 0.2 packs/day for 10 years    Quit date: 09/22/2004  . Smokeless tobacco: Never Used  . Alcohol Use: 1.2 oz/week    2 Glasses of wine per week  . Drug Use: No  . Sexually Active: Yes    Birth Control/ Protection: Surgical   Other Topics Concern  . Not on file   Social History Narrative   2 Step DaughtersHobby -  scuba diving   Family History  Problem Relation Age of Onset  . Cancer Mother     head and neck  . Other Mother     aortic valve replacement  . Hypertension Mother   . Arthritis Mother   . Osteoporosis Mother   . Other Sister     tachycardia  . Cancer Maternal Grandmother     Colon  . Cancer Maternal Grandfather     Lung   Patient Active Problem List  Diagnoses  . Sarcoidosis  . THYROID NODULE, LEFT  . GOITER, MULTINODULAR  . HYPOTHYROIDISM  . GLUCOCORTICOID DEFICIENCY  . PERIORBITAL CELLULITIS  . DYSFUNCTION OF EUSTACHIAN TUBE  . TINNITUS  . HYPERTENSION  . HAND PAIN, RIGHT  . PALPITATIONS, OCCASIONAL  . HYPERGLYCEMIA  . PARESTHESIA  . HOT FLASHES  . Anxiety  . Arthritis  . Hyperlipidemia  . Basal cell carcinoma of skin  . ANA positive  . Menopause  . Gallstones  . Hypercalcemia  . Sarcoid neuropathy   Current Outpatient Prescriptions on File Prior to Visit  Medication Sig Dispense Refill  . acyclovir (ZOVIRAX) 400  MG tablet Take 1 tablet by mouth Daily.      Marland Kitchen amitriptyline (ELAVIL) 25 MG tablet take 1/2 tab for 1 week at night, then increase to 1 tab at night from then on.  May increase to 2 tabs at night.  60 tablet  3  . bisoprolol (ZEBETA) 5 MG tablet 1/2 tablet by mouth once a day  45 tablet  3  . gabapentin (NEURONTIN) 300 MG capsule increase to 2 tablets three times a day as directed.  180 capsule  3  . levothyroxine (SYNTHROID, LEVOTHROID) 125 MCG tablet Take 125 mcg by mouth daily.        Marland Kitchen estradiol (VIVELLE-DOT) 0.05 MG/24HR Change skin patch two times per week  8 patch  1       Review of Systems see HPI   Objective:   Physical Exam  Nursing note and vitals reviewed.  Constitutional: She is oriented to person, place, and time. She appears well-developed and well-nourished.  HENT:  Head: Normocephalic and atraumatic.  Cardiovascular: Normal rate and regular rhythm. Exam reveals no gallop and no friction rub.  No murmur heard.    Pulmonary/Chest: Breath sounds normal. She has no wheezes. She has no rales.  Neurological: She is alert and oriented to person, place, and time.  Skin: Skin is warm and dry.  Psychiatric: She has a normal mood and affect. Her behavior is normal.          Physical Exam Physical Exam  Nursing note and vitals reviewed.  Constitutional: She is oriented to person, place, and time. She appears well-developed and well-nourished.  HENT:  Head: Normocephalic and atraumatic.  Cardiovascular: Normal rate and regular rhythm. Exam reveals no gallop and no friction rub.  No murmur heard.  Pulmonary/Chest: Breath sounds normal. She has no wheezes. She has no rales.  Neurological: She is alert and oriented to person, place, and time.  Skin: Skin is warm and dry.  Psychiatric: She has a normal mood and affect. Her behavior is normal.         Assessment & Plan:  1)  Vasomotor flushes.  I counseled pt in great detail risk/benefit ratio of HT>  See scanned and signed risk/benefit educational sheet.  I offered non HT options but she would like to try estradiol for one year .  Pt  counseled if any leg swelling , chest pain, or CVA symptoms to call office.  Short course of estradiol patch  reasonable given degree of symptomatology.  Will give Minivelle .05 patch to be changed 2 times per week.  She voices understanding.  Most recent TSH normal.   2)  HTN  Well controlled recheck BP in 3 months after initiating HT 3)  Sarcoid/ hypercalcemia   Will check Calcium at next visit 4)  Sarcoid neuropathy.  Improved with current amitriptyline and neurontin.  See me in 3 months.

## 2012-02-06 ENCOUNTER — Ambulatory Visit: Payer: Managed Care, Other (non HMO) | Admitting: Neurology

## 2012-03-01 ENCOUNTER — Other Ambulatory Visit: Payer: Self-pay | Admitting: *Deleted

## 2012-03-01 DIAGNOSIS — Z780 Asymptomatic menopausal state: Secondary | ICD-10-CM

## 2012-03-01 MED ORDER — ESTRADIOL 0.05 MG/24HR TD PTTW: MEDICATED_PATCH | TRANSDERMAL | Status: DC

## 2012-03-09 ENCOUNTER — Other Ambulatory Visit: Payer: Self-pay | Admitting: *Deleted

## 2012-03-29 ENCOUNTER — Ambulatory Visit (INDEPENDENT_AMBULATORY_CARE_PROVIDER_SITE_OTHER): Payer: Managed Care, Other (non HMO) | Admitting: Internal Medicine

## 2012-03-29 ENCOUNTER — Encounter: Payer: Self-pay | Admitting: Internal Medicine

## 2012-03-29 VITALS — BP 123/78 | HR 67 | Temp 97.6°F | Resp 16 | Ht 69.5 in | Wt 203.0 lb

## 2012-03-29 DIAGNOSIS — I10 Essential (primary) hypertension: Secondary | ICD-10-CM

## 2012-03-29 DIAGNOSIS — N951 Menopausal and female climacteric states: Secondary | ICD-10-CM

## 2012-03-29 DIAGNOSIS — E042 Nontoxic multinodular goiter: Secondary | ICD-10-CM

## 2012-03-29 DIAGNOSIS — Z90710 Acquired absence of both cervix and uterus: Secondary | ICD-10-CM

## 2012-03-29 LAB — COMPREHENSIVE METABOLIC PANEL
CO2: 27 mEq/L (ref 19–32)
Creat: 1.03 mg/dL (ref 0.50–1.10)
Glucose, Bld: 87 mg/dL (ref 70–99)
Total Bilirubin: 0.5 mg/dL (ref 0.3–1.2)
Total Protein: 7.1 g/dL (ref 6.0–8.3)

## 2012-03-29 LAB — TSH: TSH: 2.954 u[IU]/mL (ref 0.350–4.500)

## 2012-03-29 MED ORDER — ESTRADIOL 0.05 MG/24HR TD PTTW: MEDICATED_PATCH | TRANSDERMAL | Status: DC

## 2012-03-29 NOTE — Patient Instructions (Addendum)
See me as needed  Labs will be mailed to you 

## 2012-03-30 ENCOUNTER — Encounter: Payer: Self-pay | Admitting: *Deleted

## 2012-03-30 ENCOUNTER — Encounter: Payer: Self-pay | Admitting: Internal Medicine

## 2012-03-30 DIAGNOSIS — Z90710 Acquired absence of both cervix and uterus: Secondary | ICD-10-CM | POA: Insufficient documentation

## 2012-03-30 NOTE — Progress Notes (Signed)
Subjective:    Patient ID: Shannon Wright, female    DOB: 11-Nov-1955, 56 y.o.   MRN: [id number]  HPI Shannon Wright is here for follow up after started vivelle dot.  She states her hot flushes are 90% improved and she feels much better.   She still has problems with her neuropathy in her feet felt secondary to sarcoid.  She sees Dr. Modesto Charon her neurologist for this  Also has daily sadness at 5 pm that has gone on for years.   Does not report daily derpession.  Mother died in 05-Jun-2023 and Shannon Wright states she was an alcoholic and used to drink every evening around 5 pm.  She has a therapist she likes and repeteadly states in interview she is not depressed and does ot want meds.  She denies suicidal ideation  She will be seeing a podiatrist at [facility] soon  No Known Allergies Past Medical History  Diagnosis Date  . Sarcoidosis   . Right knee meniscal tear   . ANA positive   . Genital ulcer, female     question Bechets  . Menopause   . Anxiety     chronic  . Arthritis   . Hypertension   . Hyperlipidemia   . Thyroid disease     MNG S/P total lthyroidectomy  . Basal cell carcinoma of skin   . Hypercalcemia    Past Surgical History  Procedure Date  . Total thyroidectomy   . Abdominal hysterectomy 2005  . Bilateral salpingoophorectomy 2005   History   Social History  . Marital Status: Married    Spouse Name: Bill    Number of Children: 0  . Years of Education: N/A   Occupational History  . VP    Social History Main Topics  . Smoking status: Former Smoker -- 0.2 packs/day for 10 years    Quit date: 09/22/2004  . Smokeless tobacco: Never Used  . Alcohol Use: 1.2 oz/week    2 Glasses of wine per week  . Drug Use: No  . Sexually Active: Yes    Birth Control/ Protection: Surgical   Other Topics Concern  . Not on file   Social History Narrative   2 Step DaughtersHobby - scuba diving   Family History  Problem Relation Age of Onset  . Cancer Mother     head and neck  . Other  Mother     aortic valve replacement  . Hypertension Mother   . Arthritis Mother   . Osteoporosis Mother   . Other Sister     tachycardia  . Cancer Maternal Grandmother     Colon  . Cancer Maternal Grandfather     Lung   Patient Active Problem List  Diagnosis  . Sarcoidosis  . THYROID NODULE, LEFT  . GOITER, MULTINODULAR  . HYPOTHYROIDISM  . GLUCOCORTICOID DEFICIENCY  . PERIORBITAL CELLULITIS  . DYSFUNCTION OF EUSTACHIAN TUBE  . TINNITUS  . HYPERTENSION  . HAND PAIN, RIGHT  . PALPITATIONS, OCCASIONAL  . HYPERGLYCEMIA  . PARESTHESIA  . HOT FLASHES  . Anxiety  . Arthritis  . Hyperlipidemia  . Basal cell carcinoma of skin  . ANA positive  . Menopause  . Gallstones  . Hypercalcemia  . Sarcoid neuropathy  . S/P hysterectomy   Current Outpatient Prescriptions on File Prior to Visit  Medication Sig Dispense Refill  . acyclovir (ZOVIRAX) 400 MG tablet Take 1 tablet by mouth Daily.      Marland Kitchen amitriptyline (ELAVIL) 25 MG tablet take 1/2 tab for  1 week at night, then increase to 1 tab at night from then on.  May increase to 2 tabs at night.  60 tablet  3  . bisoprolol (ZEBETA) 5 MG tablet 1/2 tablet by mouth once a day  45 tablet  3  . estradiol (VIVELLE-DOT) 0.05 MG/24HR Change skin patch two times per week  8 patch  5  . gabapentin (NEURONTIN) 300 MG capsule increase to 2 tablets three times a day as directed.  180 capsule  3  . levothyroxine (SYNTHROID, LEVOTHROID) 125 MCG tablet Take 125 mcg by mouth daily.              Review of Systems    see HPI Objective:   Physical Exam Physical Exam  Nursing note and vitals reviewed.  Constitutional: She is oriented to person, place, and time. She appears well-developed and well-nourished.  HENT:  Head: Normocephalic and atraumatic.  Cardiovascular: Normal rate and regular rhythm. Exam reveals no gallop and no friction rub.  No murmur heard.  Pulmonary/Chest: Breath sounds normal. She has no wheezes. She has no rales.    Neurological: She is alert and oriented to person, place, and time.  Skin: Skin is warm and dry.  Psychiatric: She has a normal mood and affect. Her behavior is normal.              Assessment & Plan:  Symptomatic menopause will continue minivelle  for 1 year and then re-evaluate  Sarcoid    History of hypercalcemia  Will recheck today  Situational stress continue with therapist,  Repeatedly declines meds now  Counseled if sadness worsening to see me in office  Peripheral neuropathy  Contine with her neurologist  HTN well controlled

## 2012-06-07 ENCOUNTER — Telehealth: Payer: Self-pay | Admitting: *Deleted

## 2012-06-09 NOTE — Telephone Encounter (Signed)
Pt advised to go to UC for acute back injury

## 2012-07-28 ENCOUNTER — Other Ambulatory Visit: Payer: Self-pay | Admitting: *Deleted

## 2012-07-28 DIAGNOSIS — I10 Essential (primary) hypertension: Secondary | ICD-10-CM

## 2012-07-28 MED ORDER — BISOPROLOL FUMARATE 5 MG PO TABS: ORAL_TABLET | ORAL | Status: DC

## 2012-07-28 NOTE — Telephone Encounter (Signed)
Needs refill

## 2012-08-12 ENCOUNTER — Encounter: Payer: Self-pay | Admitting: Internal Medicine

## 2012-08-12 ENCOUNTER — Ambulatory Visit (INDEPENDENT_AMBULATORY_CARE_PROVIDER_SITE_OTHER): Payer: Managed Care, Other (non HMO) | Admitting: Internal Medicine

## 2012-08-12 VITALS — BP 140/90 | HR 80 | Temp 97.0°F | Resp 16 | Ht 70.0 in | Wt 206.0 lb

## 2012-08-12 DIAGNOSIS — Z23 Encounter for immunization: Secondary | ICD-10-CM

## 2012-08-12 DIAGNOSIS — D869 Sarcoidosis, unspecified: Secondary | ICD-10-CM

## 2012-08-12 DIAGNOSIS — E039 Hypothyroidism, unspecified: Secondary | ICD-10-CM

## 2012-08-12 DIAGNOSIS — I10 Essential (primary) hypertension: Secondary | ICD-10-CM

## 2012-08-12 MED ORDER — BISOPROLOL FUMARATE 5 MG PO TABS: 5.0000 mg | ORAL_TABLET | Freq: Every day | ORAL | Status: DC

## 2012-08-12 MED ORDER — LEVOTHYROXINE SODIUM 125 MCG PO TABS: 125.0000 ug | ORAL_TABLET | Freq: Every day | ORAL | Status: DC

## 2012-08-12 NOTE — Progress Notes (Signed)
Subjective:    Patient ID: Shannon Wright, female    DOB: Aug 11, 1956, 56 y.o.   MRN: [id number]  HPI  Shannon Wright is here for follow up of HTN.  She is taking 1/2 tab of Zebeta daily and BP has been running high.  She was at dermatologist office and SBP was in 150's  She is asymptomatic  She still have pain in feet and numbness felt secondary to sarcoid neuropathy.  Dr. Modesto Charon tried her on neurontin, she was intolerant.  She would like to come off the amitriptyline.  Podiatry has not been helpful either  She state she had her mm at [facility].  I do not have report  No Known Allergies Past Medical History  Diagnosis Date  . Sarcoidosis   . Right knee meniscal tear   . ANA positive   . Genital ulcer, female     question Bechets  . Menopause   . Anxiety     chronic  . Arthritis   . Hypertension   . Hyperlipidemia   . Thyroid disease     MNG S/P total lthyroidectomy  . Basal cell carcinoma of skin   . Hypercalcemia    Past Surgical History  Procedure Date  . Total thyroidectomy   . Abdominal hysterectomy 2005  . Bilateral salpingoophorectomy 2005   History   Social History  . Marital Status: Married    Spouse Name: Bill    Number of Children: 0  . Years of Education: N/A   Occupational History  . VP    Social History Main Topics  . Smoking status: Former Smoker -- 0.2 packs/day for 10 years    Quit date: 09/22/2004  . Smokeless tobacco: Never Used  . Alcohol Use: 1.2 oz/week    2 Glasses of wine per week  . Drug Use: No  . Sexually Active: Yes    Birth Control/ Protection: Surgical   Other Topics Concern  . Not on file   Social History Narrative   2 Step DaughtersHobby - scuba diving   Family History  Problem Relation Age of Onset  . Cancer Mother     head and neck  . Other Mother     aortic valve replacement  . Hypertension Mother   . Arthritis Mother   . Osteoporosis Mother   . Other Sister     tachycardia  . Cancer Maternal Grandmother      Colon  . Cancer Maternal Grandfather     Lung   Patient Active Problem List  Diagnosis  . Sarcoidosis  . THYROID NODULE, LEFT  . GOITER, MULTINODULAR  . HYPOTHYROIDISM  . GLUCOCORTICOID DEFICIENCY  . PERIORBITAL CELLULITIS  . DYSFUNCTION OF EUSTACHIAN TUBE  . TINNITUS  . HYPERTENSION  . HAND PAIN, RIGHT  . PALPITATIONS, OCCASIONAL  . HYPERGLYCEMIA  . PARESTHESIA  . HOT FLASHES  . Anxiety  . Arthritis  . Hyperlipidemia  . Basal cell carcinoma of skin  . ANA positive  . Menopause  . Gallstones  . Hypercalcemia  . Sarcoid neuropathy  . S/P hysterectomy   Current Outpatient Prescriptions on File Prior to Visit  Medication Sig Dispense Refill  . acyclovir (ZOVIRAX) 400 MG tablet Take 1 tablet by mouth Daily.      Marland Kitchen amitriptyline (ELAVIL) 25 MG tablet take 1/2 tab for 1 week at night, then increase to 1 tab at night from then on.  May increase to 2 tabs at night.  60 tablet  3  . estradiol (VIVELLE-DOT) 0.05  MG/24HR Change skin patch two times per week  8 patch  5  . [DISCONTINUED] bisoprolol (ZEBETA) 5 MG tablet 1/2 tablet by mouth once a day  45 tablet  3  . [DISCONTINUED] levothyroxine (SYNTHROID, LEVOTHROID) 125 MCG tablet Take 125 mcg by mouth daily.            Review of Systems See HPI    Objective:   Physical Exam Physical Exam  Nursing note and vitals reviewed.  Constitutional: She is oriented to person, place, and time. She appears well-developed and well-nourished.  HENT:  Head: Normocephalic and atraumatic.  Cardiovascular: Normal rate and regular rhythm. Exam reveals no gallop and no friction rub.  No murmur heard.  Pulmonary/Chest: Breath sounds normal. She has no wheezes. She has no rales.  Neurological: She is alert and oriented to person, place, and time.  Skin: Skin is warm and dry.  Psychiatric: She has a normal mood and affect. Her behavior is normal.             Assessment & Plan:  HTN  Advised to take one whole tablet 5 mg of  Zebeta  Hypothyroidism  TSH normal  Will reorder levothyroixine  Peripheral neuropathy  Ok to taper off amitriptyline  See me in 4-6 weeks for repeat BP  Flu vaccine given today

## 2012-08-25 ENCOUNTER — Encounter: Payer: Self-pay | Admitting: *Deleted

## 2012-08-31 ENCOUNTER — Telehealth: Payer: Self-pay | Admitting: *Deleted

## 2012-08-31 NOTE — Telephone Encounter (Signed)
Pt would like to know if you have reviewed her mm results and if you can increase her HT patch

## 2012-09-01 ENCOUNTER — Telehealth: Payer: Self-pay | Admitting: *Deleted

## 2012-09-01 MED ORDER — ESTRADIOL 0.075 MG/24HR TD PTTW: 1.0000 | MEDICATED_PATCH | TRANSDERMAL | Status: DC

## 2012-09-01 NOTE — Telephone Encounter (Signed)
Called left message regarding HT patch and MM

## 2012-09-01 NOTE — Telephone Encounter (Signed)
Shannon Wright   Call pt and let her know her mammogram is fine and I increased her vivelle dot skin patch  to .075 mg to change twice a week.    Have her follow up with me in 3-4 months .   I ordered meds in EPIC

## 2012-09-01 NOTE — Addendum Note (Signed)
Addended by: Raechel Chute D on: 09/01/2012 12:14 PM   Modules accepted: Orders

## 2012-09-06 ENCOUNTER — Ambulatory Visit: Payer: Managed Care, Other (non HMO) | Admitting: Critical Care Medicine

## 2012-09-07 ENCOUNTER — Ambulatory Visit (INDEPENDENT_AMBULATORY_CARE_PROVIDER_SITE_OTHER): Payer: Managed Care, Other (non HMO) | Admitting: Internal Medicine

## 2012-09-07 ENCOUNTER — Encounter: Payer: Self-pay | Admitting: *Deleted

## 2012-09-07 ENCOUNTER — Encounter: Payer: Self-pay | Admitting: Internal Medicine

## 2012-09-07 VITALS — BP 118/84 | HR 81 | Temp 97.6°F | Resp 18 | Wt 202.0 lb

## 2012-09-07 DIAGNOSIS — I10 Essential (primary) hypertension: Secondary | ICD-10-CM

## 2012-09-07 DIAGNOSIS — E039 Hypothyroidism, unspecified: Secondary | ICD-10-CM

## 2012-09-07 DIAGNOSIS — D869 Sarcoidosis, unspecified: Secondary | ICD-10-CM

## 2012-09-07 DIAGNOSIS — N951 Menopausal and female climacteric states: Secondary | ICD-10-CM

## 2012-09-07 NOTE — Patient Instructions (Addendum)
See me in 6 month or sooner as needed

## 2012-09-07 NOTE — Progress Notes (Signed)
Subjective:    Patient ID: Shannon Wright, female    DOB: 04/27/1956, 56 y.o.   MRN: [id number]  HPI  Shannon Wright is here for follow up after increasing her Zebeta to 5 mg daily.  She is feeling fine.  She tells me she will be retiring in 60 days and is greatly looking forward to this as her job is very stressful.    She is now taking .075 mg of vivelle dot and just started this.  For her flushes  No Known Allergies Past Medical History  Diagnosis Date  . Sarcoidosis   . Right knee meniscal tear   . ANA positive   . Genital ulcer, female     question Bechets  . Menopause   . Anxiety     chronic  . Arthritis   . Hypertension   . Hyperlipidemia   . Thyroid disease     MNG S/P total lthyroidectomy  . Basal cell carcinoma of skin   . Hypercalcemia    Past Surgical History  Procedure Date  . Total thyroidectomy   . Abdominal hysterectomy 2005  . Bilateral salpingoophorectomy 2005   History   Social History  . Marital Status: Married    Spouse Name: Shannon Wright    Number of Children: 0  . Years of Education: N/A   Occupational History  . VP    Social History Main Topics  . Smoking status: Former Smoker -- 0.2 packs/day for 10 years    Quit date: 09/22/2004  . Smokeless tobacco: Never Used  . Alcohol Use: 1.2 oz/week    2 Glasses of wine per week  . Drug Use: No  . Sexually Active: Yes    Birth Control/ Protection: Surgical   Other Topics Concern  . Not on file   Social History Narrative   2 Step DaughtersHobby - scuba diving   Family History  Problem Relation Age of Onset  . Cancer Mother     head and neck  . Other Mother     aortic valve replacement  . Hypertension Mother   . Arthritis Mother   . Osteoporosis Mother   . Other Sister     tachycardia  . Cancer Maternal Grandmother     Colon  . Cancer Maternal Grandfather     Lung   Patient Active Problem List  Diagnosis  . Sarcoidosis  . THYROID NODULE, LEFT  . GOITER, MULTINODULAR  . HYPOTHYROIDISM   . GLUCOCORTICOID DEFICIENCY  . PERIORBITAL CELLULITIS  . DYSFUNCTION OF EUSTACHIAN TUBE  . TINNITUS  . HYPERTENSION  . HAND PAIN, RIGHT  . PALPITATIONS, OCCASIONAL  . HYPERGLYCEMIA  . PARESTHESIA  . HOT FLASHES  . Anxiety  . Arthritis  . Hyperlipidemia  . Basal cell carcinoma of skin  . ANA positive  . Menopause  . Gallstones  . Hypercalcemia  . Sarcoid neuropathy  . S/P hysterectomy   Current Outpatient Prescriptions on File Prior to Visit  Medication Sig Dispense Refill  . acyclovir (ZOVIRAX) 400 MG tablet Take 1 tablet by mouth Daily.      Marland Kitchen amitriptyline (ELAVIL) 25 MG tablet take 1/2 tab for 1 week at night, then increase to 1 tab at night from then on.  May increase to 2 tabs at night.  60 tablet  3  . bisoprolol (ZEBETA) 5 MG tablet Take 1 tablet (5 mg total) by mouth daily.  30 tablet  11  . estradiol (VIVELLE-DOT) 0.075 MG/24HR Place 1 patch onto the skin 2 (two) times  a week.  8 patch  6  . levothyroxine (SYNTHROID, LEVOTHROID) 125 MCG tablet Take 1 tablet (125 mcg total) by mouth daily.  90 tablet  3      Review of Systems See HPI    Objective:   Physical Exam Physical Exam  Nursing note and vitals reviewed.  Constitutional: She is oriented to person, place, and time. She appears well-developed and well-nourished.  HENT:  Head: Normocephalic and atraumatic.  Cardiovascular: Normal rate and regular rhythm. Exam reveals no gallop and no friction rub.  No murmur heard.  Pulmonary/Chest: Breath sounds normal. She has no wheezes. She has no rales.  Neurological: She is alert and oriented to person, place, and time.  Skin: Skin is warm and dry.  Psychiatric: She has a normal mood and affect. Her behavior is normal.        Assessment & Plan:  HTN  Continue current med of Zebeta 5 mg  Vasomotor flushes  On vivelle dot .075 mg twice weekly  Sarcoid    Hypothyroidsim  Continue current med

## 2012-09-09 ENCOUNTER — Ambulatory Visit: Payer: Managed Care, Other (non HMO) | Admitting: Critical Care Medicine

## 2012-09-09 ENCOUNTER — Ambulatory Visit: Payer: Managed Care, Other (non HMO) | Admitting: Internal Medicine

## 2012-09-19 ENCOUNTER — Encounter: Payer: Self-pay | Admitting: Internal Medicine

## 2012-09-19 DIAGNOSIS — E890 Postprocedural hypothyroidism: Secondary | ICD-10-CM | POA: Insufficient documentation

## 2012-10-14 ENCOUNTER — Ambulatory Visit (INDEPENDENT_AMBULATORY_CARE_PROVIDER_SITE_OTHER): Payer: Managed Care, Other (non HMO) | Admitting: Critical Care Medicine

## 2012-10-14 ENCOUNTER — Encounter: Payer: Self-pay | Admitting: Critical Care Medicine

## 2012-10-14 VITALS — BP 110/82 | HR 74 | Temp 98.1°F | Ht 70.0 in | Wt 208.0 lb

## 2012-10-14 DIAGNOSIS — D869 Sarcoidosis, unspecified: Secondary | ICD-10-CM

## 2012-10-14 MED ORDER — MELOXICAM 15 MG PO TABS: 15.0000 mg | ORAL_TABLET | Freq: Every day | ORAL | Status: DC | PRN

## 2012-10-14 NOTE — Patient Instructions (Signed)
No change in medications. Ok to try meloxicam as needed again if desired Ok to consider stopping amitryptiline Return 1 year or as needed

## 2012-10-14 NOTE — Progress Notes (Signed)
Subjective:    Patient ID: Shannon Wright, female    DOB: 1956/03/03, 57 y.o.   MRN: [id number]  HPI  This is a 57 y.o.  female with sarcoidosis Stage III   10/14/2012 This patient has not been seen since November 2012. The patient still is having  neuropathy issues in the lower extremities. She did not find that Neurontin was of any benefit and had side effects with this. She states meloxicam helps the best. Her calcium levels have been stable around 10. There is no cough or dyspnea. There is no chest pain. She is having some reflux symptoms.    Past Medical History  Diagnosis Date  . Sarcoidosis   . Right knee meniscal tear   . ANA positive   . Genital ulcer, female     question Bechets  . Menopause   . Anxiety     chronic  . Arthritis   . Hypertension   . Hyperlipidemia   . Thyroid disease     MNG S/P total lthyroidectomy  . Basal cell carcinoma of skin   . Hypercalcemia      Family History  Problem Relation Age of Onset  . Cancer Mother     head and neck  . Other Mother     aortic valve replacement  . Hypertension Mother   . Arthritis Mother   . Osteoporosis Mother   . Other Sister     tachycardia  . Cancer Maternal Grandmother     Colon  . Cancer Maternal Grandfather     Lung     History   Social History  . Marital Status: Married    Spouse Name: Bill    Number of Children: 0  . Years of Education: N/A   Occupational History  . VP    Social History Main Topics  . Smoking status: Former Smoker -- 0.2 packs/day for 10 years    Quit date: 09/22/2004  . Smokeless tobacco: Never Used  . Alcohol Use: 1.2 oz/week    2 Glasses of wine per week  . Drug Use: No  . Sexually Active: Yes    Birth Control/ Protection: Surgical   Other Topics Concern  . Not on file   Social History Narrative   2 Step DaughtersHobby - scuba diving     No Known Allergies   Outpatient Prescriptions Prior to Visit  Medication Sig Dispense Refill  . acyclovir  (ZOVIRAX) 400 MG tablet Take 1 tablet by mouth Daily.      . bisoprolol (ZEBETA) 5 MG tablet Take 1 tablet (5 mg total) by mouth daily.  30 tablet  11  . estradiol (VIVELLE-DOT) 0.075 MG/24HR Place 1 patch onto the skin 2 (two) times a week.  8 patch  6  . levothyroxine (SYNTHROID, LEVOTHROID) 125 MCG tablet Take 1 tablet (125 mcg total) by mouth daily.  90 tablet  3  . [DISCONTINUED] amitriptyline (ELAVIL) 25 MG tablet take 1/2 tab for 1 week at night, then increase to 1 tab at night from then on.  May increase to 2 tabs at night.  60 tablet  3  Last reviewed on 10/14/2012  9:33 AM by Storm Frisk, MD    Review of Systems  Constitutional:   No  weight loss, night sweats,  Fevers, chills, fatigue, lassitude. HEENT:   No headaches,  Difficulty swallowing,  Tooth/dental problems,  Sore throat,                No sneezing, itching,  ear ache, nasal congestion, post nasal drip,   CV:  No chest pain,  Orthopnea, PND, swelling in lower extremities, anasarca, dizziness, palpitations  GI  Notes some  heartburn, indigestion, abdominal pain, nausea, vomiting, diarrhea, change in bowel habits, loss of appetite  Resp: No shortness of breath with exertion or at rest.  No excess mucus, no productive cough,  No non-productive cough,  No coughing up of blood.  No change in color of mucus.  No wheezing.  No chest wall deformity  Skin: no rash or lesions.  GU: no dysuria, change in color of urine, no urgency or frequency.  No flank pain.  MS:  No joint pain or swelling.  No decreased range of motion.  No back pain.  Neuro:  Numb feet.  Psych:  No change in mood or affect. No depression or anxiety.  No memory loss.     Objective:   Physical Exam   Filed Vitals:   10/14/12 0920  BP: 110/82  Pulse: 74  Temp: 98.1 F (36.7 C)  TempSrc: Oral  Height: 5\' 10"  (1.778 m)  Weight: 208 lb (94.348 kg)  SpO2: 98%    Gen: Pleasant, well-nourished, in no distress,  normal affect  ENT: No lesions,   mouth clear,  oropharynx clear, no postnasal drip  Neck: No JVD, no TMG, no carotid bruits  Lungs: No use of accessory muscles, no dullness to percussion, clear without rales or rhonchi  Cardiovascular: RRR, heart sounds normal, no murmur or gallops, no peripheral edema  Abdomen: soft and NT, no HSM,  BS normal  Musculoskeletal: No deformities, no cyanosis or clubbing  Neuro: alert, non focal, numbness at base of feet   Skin: Warm, no lesions or rashes  10/14/2012   Cleda Daub:  normal     Assessment & Plan:   Sarcoidosis Pulmonary sarcoidosis with diffuse micronodular disease stable at this time Associated hypercalcemia do to sarcoidosis and associated neuropathy  Sarcoidosis is stable with normal pulmonary functions on 10/14/12 Hypercalcemia also stable at  levels  Of 10 Plan Maintain off systemic steroids Monitor expectantly Return 1 year   Hypercalcemia Stable hypercalcemia on the basis of sarcoidosis Will monitor    Updated Medication List Outpatient Encounter Prescriptions as of 10/14/2012  Medication Sig Dispense Refill  . acyclovir (ZOVIRAX) 400 MG tablet Take 1 tablet by mouth Daily.      Marland Kitchen amitriptyline (ELAVIL) 25 MG tablet Take 25 mg by mouth at bedtime.      . bisoprolol (ZEBETA) 5 MG tablet Take 1 tablet (5 mg total) by mouth daily.  30 tablet  11  . estradiol (VIVELLE-DOT) 0.075 MG/24HR Place 1 patch onto the skin 2 (two) times a week.  8 patch  6  . levothyroxine (SYNTHROID, LEVOTHROID) 125 MCG tablet Take 1 tablet (125 mcg total) by mouth daily.  90 tablet  3  . [DISCONTINUED] amitriptyline (ELAVIL) 25 MG tablet take 1/2 tab for 1 week at night, then increase to 1 tab at night from then on.  May increase to 2 tabs at night.  60 tablet  3  . meloxicam (MOBIC) 15 MG tablet Take 1 tablet (15 mg total) by mouth daily as needed for pain.  30 tablet  2

## 2012-10-14 NOTE — Assessment & Plan Note (Signed)
Pulmonary sarcoidosis with diffuse micronodular disease stable at this time Associated hypercalcemia do to sarcoidosis and associated neuropathy  Sarcoidosis is stable with normal pulmonary functions on 10/14/12 Hypercalcemia also stable at  levels  Of 10 Plan Maintain off systemic steroids Monitor expectantly Return 1 year

## 2012-10-14 NOTE — Assessment & Plan Note (Signed)
Stable hypercalcemia on the basis of sarcoidosis Will monitor

## 2012-11-25 ENCOUNTER — Other Ambulatory Visit: Payer: Self-pay | Admitting: Internal Medicine

## 2012-11-25 MED ORDER — LEVOTHYROXINE SODIUM 125 MCG PO TABS: 125.0000 ug | ORAL_TABLET | Freq: Every day | ORAL | Status: DC

## 2012-11-25 NOTE — Telephone Encounter (Signed)
Pt called in on 03.06.14 at 9 am: she  Needs refill on levothyroxine (SYNTHROID, LEVOTHROID) 125 MCG tablet Please send into [facility] ([phone])... SHE NEEDS A 90 DAY SUPPLY...

## 2012-12-02 ENCOUNTER — Other Ambulatory Visit: Payer: Self-pay | Admitting: *Deleted

## 2012-12-02 NOTE — Telephone Encounter (Signed)
Left message regarding refill of synthroid

## 2013-03-08 ENCOUNTER — Encounter: Payer: Self-pay | Admitting: Internal Medicine

## 2013-03-08 ENCOUNTER — Ambulatory Visit (INDEPENDENT_AMBULATORY_CARE_PROVIDER_SITE_OTHER): Payer: BC Managed Care – PPO | Admitting: Internal Medicine

## 2013-03-08 VITALS — BP 130/70 | HR 71 | Temp 97.1°F | Resp 16 | Ht 70.0 in | Wt 194.0 lb

## 2013-03-08 DIAGNOSIS — E785 Hyperlipidemia, unspecified: Secondary | ICD-10-CM

## 2013-03-08 DIAGNOSIS — E042 Nontoxic multinodular goiter: Secondary | ICD-10-CM

## 2013-03-08 DIAGNOSIS — I10 Essential (primary) hypertension: Secondary | ICD-10-CM

## 2013-03-08 DIAGNOSIS — N951 Menopausal and female climacteric states: Secondary | ICD-10-CM

## 2013-03-08 DIAGNOSIS — E039 Hypothyroidism, unspecified: Secondary | ICD-10-CM

## 2013-03-08 DIAGNOSIS — D869 Sarcoidosis, unspecified: Secondary | ICD-10-CM

## 2013-03-08 LAB — COMPREHENSIVE METABOLIC PANEL
Alkaline Phosphatase: 48 U/L (ref 39–117)
BUN: 17 mg/dL (ref 6–23)
Glucose, Bld: 87 mg/dL (ref 70–99)
Total Bilirubin: 0.6 mg/dL (ref 0.3–1.2)

## 2013-03-08 LAB — LIPID PANEL
HDL: 51 mg/dL (ref >39)
LDL Cholesterol: 125 mg/dL — ABNORMAL HIGH (ref 0–99)
Triglycerides: 95 mg/dL (ref <150)
VLDL: 19 mg/dL (ref 0–40)

## 2013-03-08 LAB — CBC WITH DIFFERENTIAL/PLATELET
Basophils Absolute: 0 10*3/uL (ref 0.0–0.1)
Basophils Relative: 1 % (ref 0–1)
HCT: 39.2 % (ref 36.0–46.0)
Hemoglobin: 13.5 g/dL (ref 12.0–15.0)
Lymphocytes Relative: 24 % (ref 12–46)
MCHC: 34.4 g/dL (ref 30.0–36.0)
Monocytes Absolute: 0.7 10*3/uL (ref 0.1–1.0)
Neutro Abs: 3.7 10*3/uL (ref 1.7–7.7)
Neutrophils Relative %: 59 % (ref 43–77)
RDW: 14.4 % (ref 11.5–15.5)
WBC: 6.2 10*3/uL (ref 4.0–10.5)

## 2013-03-08 MED ORDER — ACYCLOVIR 400 MG PO TABS: ORAL_TABLET | ORAL | Status: DC

## 2013-03-08 MED ORDER — BISOPROLOL FUMARATE 5 MG PO TABS: 5.0000 mg | ORAL_TABLET | Freq: Every day | ORAL | Status: DC

## 2013-03-08 MED ORDER — ESTRADIOL 0.075 MG/24HR TD PTTW: 1.0000 | MEDICATED_PATCH | TRANSDERMAL | Status: DC

## 2013-03-08 MED ORDER — LEVOTHYROXINE SODIUM 125 MCG PO TABS: 125.0000 ug | ORAL_TABLET | Freq: Every day | ORAL | Status: DC

## 2013-03-08 NOTE — Patient Instructions (Addendum)
See me in 8 weeks  

## 2013-03-08 NOTE — Progress Notes (Signed)
Subjective:    Patient ID: Shannon Wright, female    DOB: [birth year], 57 y.o.   MRN: [id number]  HPI  Shannon Wright is here for follow up.    She has lost 10 lbs  (8 lbs by my scale) and is very happy about this.  She would like to try to reduce her Zebeta if possible.  She is retired now, eating better and exercising more  She still has occasional hot flush but it is summer  Her calcium tends to run on high side of normal  No Known Allergies Past Medical History  Diagnosis Date  . Sarcoidosis   . Right knee meniscal tear   . ANA positive   . Genital ulcer, female     question Bechets  . Menopause   . Anxiety     chronic  . Arthritis   . Hypertension   . Hyperlipidemia   . Thyroid disease     MNG S/P total lthyroidectomy  . Basal cell carcinoma of skin   . Hypercalcemia    Past Surgical History  Procedure Laterality Date  . Total thyroidectomy    . Abdominal hysterectomy  2005  . Bilateral salpingoophorectomy  2005   History   Social History  . Marital Status: Married    Spouse Name: Bill    Number of Children: 0  . Years of Education: N/A   Occupational History  . VP    Social History Main Topics  . Smoking status: Former Smoker -- 0.25 packs/day for 10 years    Quit date: 09/22/2004  . Smokeless tobacco: Never Used  . Alcohol Use: 1.2 oz/week    2 Glasses of wine per week  . Drug Use: No  . Sexually Active: Yes    Birth Control/ Protection: Surgical   Other Topics Concern  . Not on file   Social History Narrative   2 Step Daughters   Hobby - scuba diving         Family History  Problem Relation Age of Onset  . Cancer Mother     head and neck  . Other Mother     aortic valve replacement  . Hypertension Mother   . Arthritis Mother   . Osteoporosis Mother   . Other Sister     tachycardia  . Cancer Maternal Grandmother     Colon  . Cancer Maternal Grandfather     Lung   Patient Active Problem List   Diagnosis Date Noted  . S/P total  thyroidectomy 09/19/2012  . S/P hysterectomy 03/30/2012  . 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 Hypercalcemia 07/31/2011  . Gallstones 07/28/2011  . Anxiety   . Arthritis   . Hyperlipidemia   . Basal cell carcinoma of skin   . ANA positive   . Menopause   . PARESTHESIA 12/05/2010  . HOT FLASHES 12/05/2010  . HYPOTHYROIDISM 06/07/2010  . 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 GOITER, MULTINODULAR 01/03/2010  . THYROID NODULE, LEFT 12/26/2009  . PERIORBITAL CELLULITIS 12/24/2009  . DYSFUNCTION OF EUSTACHIAN TUBE 12/14/2009  . HAND PAIN, RIGHT 12/14/2009  . PALPITATIONS, OCCASIONAL 08/14/2009  . HYPERGLYCEMIA 05/16/2009  . GLUCOCORTICOID DEFICIENCY 04/19/2009  . Sarcoidosis 03/22/2009  . HYPERTENSION 03/22/2009   No current outpatient prescriptions on file prior to visit.   No current facility-administered medications on file prior to visit.     Review of Systems See HPI    Objective:   Physical Exam  Physical Exam  Nursing note and vitals reviewed.  Constitutional: She is oriented  to person, place, and time. She appears well-developed and well-nourished.  HENT:  Head: Normocephalic and atraumatic.  Cardiovascular: Normal rate and regular rhythm. Exam reveals no gallop and no friction rub.  No murmur heard.  Pulmonary/Chest: Breath sounds normal. She has no wheezes. She has no rales.  Neurological: She is alert and oriented to person, place, and time.  Skin: Skin is warm and dry.  Psychiatric: She has a normal mood and affect. Her behavior is normal.  Ext no edema           Assessment & Plan:  HTN    With weight loss I am willing to try to reduce her Zebata to 1/2 tab daily.  She is to see me in 8 weeks for Bp recheck  Check chemistries TSH today  History   of hypercalcemia    Likely due to sarcoid  Will check calcium today  Hyperlipidemia  Check today  Vasomotor flushes  Continue vivelle dot for now  Hypothurodisim  See above    See me in 8 weeks

## 2013-03-09 ENCOUNTER — Encounter: Payer: Self-pay | Admitting: *Deleted

## 2013-05-03 ENCOUNTER — Ambulatory Visit: Payer: BC Managed Care – PPO | Admitting: Internal Medicine

## 2013-06-24 ENCOUNTER — Encounter (HOSPITAL_BASED_OUTPATIENT_CLINIC_OR_DEPARTMENT_OTHER): Payer: Self-pay

## 2013-06-24 ENCOUNTER — Emergency Department (HOSPITAL_BASED_OUTPATIENT_CLINIC_OR_DEPARTMENT_OTHER)
Admission: EM | Admit: 2013-06-24 | Discharge: 2013-06-24 | Disposition: A | Discharge status: Home / Self Care | Payer: BC Managed Care – PPO | Attending: Emergency Medicine | Admitting: Emergency Medicine

## 2013-06-24 DIAGNOSIS — Z87891 Personal history of nicotine dependence: Secondary | ICD-10-CM | POA: Insufficient documentation

## 2013-06-24 DIAGNOSIS — E079 Disorder of thyroid, unspecified: Secondary | ICD-10-CM | POA: Insufficient documentation

## 2013-06-24 DIAGNOSIS — Z8739 Personal history of other diseases of the musculoskeletal system and connective tissue: Secondary | ICD-10-CM | POA: Insufficient documentation

## 2013-06-24 DIAGNOSIS — Y9389 Activity, other specified: Secondary | ICD-10-CM | POA: Insufficient documentation

## 2013-06-24 DIAGNOSIS — S61409A Unspecified open wound of unspecified hand, initial encounter: Secondary | ICD-10-CM | POA: Insufficient documentation

## 2013-06-24 DIAGNOSIS — Y9289 Other specified places as the place of occurrence of the external cause: Secondary | ICD-10-CM | POA: Insufficient documentation

## 2013-06-24 DIAGNOSIS — Z792 Long term (current) use of antibiotics: Secondary | ICD-10-CM | POA: Insufficient documentation

## 2013-06-24 DIAGNOSIS — Z8619 Personal history of other infectious and parasitic diseases: Secondary | ICD-10-CM | POA: Insufficient documentation

## 2013-06-24 DIAGNOSIS — IMO0002 Reserved for concepts with insufficient information to code with codable children: Secondary | ICD-10-CM | POA: Insufficient documentation

## 2013-06-24 DIAGNOSIS — I10 Essential (primary) hypertension: Secondary | ICD-10-CM | POA: Insufficient documentation

## 2013-06-24 DIAGNOSIS — Z8659 Personal history of other mental and behavioral disorders: Secondary | ICD-10-CM | POA: Insufficient documentation

## 2013-06-24 DIAGNOSIS — S61451A Open bite of right hand, initial encounter: Secondary | ICD-10-CM

## 2013-06-24 DIAGNOSIS — Z79899 Other long term (current) drug therapy: Secondary | ICD-10-CM | POA: Insufficient documentation

## 2013-06-24 DIAGNOSIS — Z85828 Personal history of other malignant neoplasm of skin: Secondary | ICD-10-CM | POA: Insufficient documentation

## 2013-06-24 DIAGNOSIS — W540XXA Bitten by dog, initial encounter: Secondary | ICD-10-CM | POA: Insufficient documentation

## 2013-06-24 MED ORDER — LIDOCAINE-EPINEPHRINE-TETRACAINE (LET) SOLUTION
3.0000 mL | Freq: Once | NASAL | Status: AC
  Administered 2013-06-24: 17:00:00 3 mL via TOPICAL

## 2013-06-24 MED ORDER — AMOXICILLIN-POT CLAVULANATE 875-125 MG PO TABS: 1.0000 | ORAL_TABLET | Freq: Two times a day (BID) | ORAL | Status: DC

## 2013-06-24 MED ORDER — AMOXICILLIN-POT CLAVULANATE 875-125 MG PO TABS
1.0000 | ORAL_TABLET | Freq: Once | ORAL | Status: AC
  Administered 2013-06-24: 1 via ORAL
  Filled 2013-06-24: qty 1

## 2013-06-24 MED ORDER — OXYCODONE-ACETAMINOPHEN 5-325 MG PO TABS
2.0000 | ORAL_TABLET | Freq: Once | ORAL | Status: AC
  Administered 2013-06-24: 2 via ORAL
  Filled 2013-06-24: qty 2

## 2013-06-24 MED ORDER — LIDOCAINE-EPINEPHRINE-TETRACAINE (LET) SOLUTION
NASAL | Status: AC
  Filled 2013-06-24: qty 3

## 2013-06-24 MED ORDER — OXYCODONE-ACETAMINOPHEN 5-325 MG PO TABS: 2.0000 | ORAL_TABLET | ORAL | Status: DC | PRN

## 2013-06-24 NOTE — ED Notes (Signed)
Laceration to palmar surface of right hand.

## 2013-06-24 NOTE — ED Provider Notes (Signed)
CSN: [id number]     Arrival date & time 06/24/13  1700 History   First MD Initiated Contact with Patient 06/24/13 1759     Chief Complaint  Patient presents with  . Extremity Laceration   (Consider location/radiation/quality/duration/timing/severity/associated sxs/prior Treatment) HPI  Patient was throwing something out of the yard swim her hand back and hit her dog in the mouth. His canine tooth "in to the palm of her right hand. She did clean it out. Her tetanus is up-to-date. She denies any numbness or tingling or weakness. This occurred just prior to evaluation. Her dog has immunizations up-to-date.  Past Medical History  Diagnosis Date  . Sarcoidosis   . Right knee meniscal tear   . ANA positive   . Genital ulcer, female     question Bechets  . Menopause   . Anxiety     chronic  . Arthritis   . Hypertension   . Hyperlipidemia   . Thyroid disease     MNG S/P total lthyroidectomy  . Basal cell carcinoma of skin   . Hypercalcemia    Past Surgical History  Procedure Laterality Date  . Total thyroidectomy    . Abdominal hysterectomy  2005  . Bilateral salpingoophorectomy  2005   Family History  Problem Relation Age of Onset  . Cancer Mother     head and neck  . Other Mother     aortic valve replacement  . Hypertension Mother   . Arthritis Mother   . Osteoporosis Mother   . Other Sister     tachycardia  . Cancer Maternal Grandmother     Colon  . Cancer Maternal Grandfather     Lung   History  Substance Use Topics  . Smoking status: Former Smoker -- 0.25 packs/day for 10 years    Quit date: 09/22/2004  . Smokeless tobacco: Never Used  . Alcohol Use: 1.2 oz/week    2 Glasses of wine per week     Comment: weekly   OB History   Grav Para Term Preterm Abortions TAB SAB Ect Mult Living   0 0             Review of Systems  All other systems reviewed and are negative.    Allergies  Review of patient's allergies indicates no known allergies.  Home  Medications   Current Outpatient Rx  Name  Route  Sig  Dispense  Refill  . acyclovir (ZOVIRAX) 400 MG tablet      Take one tablet bid   180 tablet   0   . amoxicillin-clavulanate (AUGMENTIN) 875-125 MG per tablet   Oral   Take 1 tablet by mouth 2 (two) times daily.   10 tablet   0   . bisoprolol (ZEBETA) 5 MG tablet   Oral   Take 1 tablet (5 mg total) by mouth daily. Take 1/2 tablet daily   30 tablet   11   . estradiol (VIVELLE-DOT) 0.075 MG/24HR   Transdermal   Place 1 patch onto the skin 2 (two) times a week.   8 patch   6   . levothyroxine (SYNTHROID, LEVOTHROID) 125 MCG tablet   Oral   Take 1 tablet (125 mcg total) by mouth daily.   90 tablet   3   . oxyCODONE-acetaminophen (PERCOCET/ROXICET) 5-325 MG per tablet   Oral   Take 2 tablets by mouth every 4 (four) hours as needed for pain.   6 tablet   0    BP  147/92  Pulse 82  Temp(Src) 98.1 F (36.7 C) (Oral)  Resp 20  Ht 5\' 9"  (1.753 m)  Wt 184 lb (83.462 kg)  BMI 27.16 kg/m2  SpO2 100% Physical Exam  Nursing note and vitals reviewed. Constitutional: She appears well-developed and well-nourished.  HENT:  Head: Normocephalic.  Eyes: Pupils are equal, round, and reactive to light.  Neck: Normal range of motion.  Musculoskeletal: Normal range of motion.       Arms: 2 cm area palm of right hand that is lacerated. Skin has been pulled back. I'm to see the bottom of the wound.    ED Course  LACERATION REPAIR Date/Time: 06/24/2013 6:20 PM Performed by: Hilario Quarry Authorized by: Hilario Quarry Consent: Verbal consent obtained. Risks and benefits: risks, benefits and alternatives were discussed Patient identity confirmed: verbally with patient Time out: Immediately prior to procedure a "time out" was called to verify the correct patient, procedure, equipment, support staff and site/side marked as required. Body area: upper extremity Location details: right hand Laceration length: 2  cm Contamination: The wound is contaminated. Foreign bodies: no foreign bodies Tendon involvement: none Nerve involvement: none Vascular damage: no Anesthesia: local infiltration Local anesthetic: lidocaine 1% with epinephrine Anesthetic total: 1 ml Patient sedated: no Preparation: Patient was prepped and draped in the usual sterile fashion. Irrigation solution: saline Irrigation method: syringe Amount of cleaning: extensive Debridement: none Degree of undermining: none Skin closure: 5-0 nylon Number of sutures: 2 Technique: simple Approximation: loose Approximation difficulty: simple Patient tolerance: Patient tolerated the procedure well with no immediate complications.   (including critical care time) Labs Review Labs Reviewed - No data to display Imaging Review No results found.  MDM   1. Dog bite, hand, right, initial encounter    I have given the patient strict return precautions for any signs of infection including redness, swelling, discoloration, pus, or increased pain. She is placed on Augmentin. She is given a splint. He is to have sutures out in 5 days but is to return immediately if any signs of infection. This does appear to be more of an abraded area and an actual bite. I was able to see the bottom of the wound well and did not appear to be a puncture in nature with.   Hilario Quarry, MD 06/24/13 Rickey Primus

## 2013-07-05 ENCOUNTER — Telehealth: Payer: Self-pay | Admitting: *Deleted

## 2013-07-05 NOTE — Telephone Encounter (Signed)
Shannon Wright called and left a message after hours on Monday requesting an appt.  I tried returning Shannon Wright's phone call at 8:30 am and at 12:30 pm.  I left her a message to call us back.

## 2013-07-12 DIAGNOSIS — R208 Other disturbances of skin sensation: Secondary | ICD-10-CM | POA: Insufficient documentation

## 2013-07-12 DIAGNOSIS — G64 Other disorders of peripheral nervous system: Secondary | ICD-10-CM | POA: Insufficient documentation

## 2013-07-12 DIAGNOSIS — M79673 Pain in unspecified foot: Secondary | ICD-10-CM | POA: Insufficient documentation

## 2013-07-14 ENCOUNTER — Encounter: Payer: Self-pay | Admitting: Internal Medicine

## 2013-07-14 ENCOUNTER — Telehealth: Payer: Self-pay | Admitting: *Deleted

## 2013-07-14 ENCOUNTER — Ambulatory Visit (INDEPENDENT_AMBULATORY_CARE_PROVIDER_SITE_OTHER): Payer: BC Managed Care – PPO | Admitting: Internal Medicine

## 2013-07-14 VITALS — BP 90/59 | HR 83 | Temp 98.2°F | Resp 18 | Wt 178.0 lb

## 2013-07-14 DIAGNOSIS — K8021 Calculus of gallbladder without cholecystitis with obstruction: Secondary | ICD-10-CM

## 2013-07-14 DIAGNOSIS — K8020 Calculus of gallbladder without cholecystitis without obstruction: Secondary | ICD-10-CM

## 2013-07-14 DIAGNOSIS — Z23 Encounter for immunization: Secondary | ICD-10-CM

## 2013-07-14 DIAGNOSIS — K8050 Calculus of bile duct without cholangitis or cholecystitis without obstruction: Secondary | ICD-10-CM

## 2013-07-14 NOTE — Telephone Encounter (Signed)
Pt notified of appt with Dr Abbey Chatters on Thursday 07/28/13 at 1300.

## 2013-07-14 NOTE — Progress Notes (Signed)
Subjective:    Patient ID: Shannon Wright, female    DOB: 04-03-1956, 57 y.o.   MRN: [id number]  HPI  Shannon Wright is here for ER follow up  Was seen in [place] ER 2 nights ago with upper abd pain felt to be biliary colic.  She reports she had severe pain radiating through to her back  With vomiting  No fever.  GB U/S showed no signs of acute cholecystitis   ACS ruled out troponins negative  Required Dilaudid for pain  Would like surgical opinion  Also has noticed lots of irritability and anger .  On effexor in the past.  Would like to see psychiatrist    Denies depressed mood .  Issues with husband and his former family  She is glad that she has intentional weight loss.   She is on Zebeta  1/2 tab daily now  No dizziness or pre-syncope  No Known Allergies Past Medical History  Diagnosis Date  . Sarcoidosis   . Right knee meniscal tear   . ANA positive   . Genital ulcer, female     question Bechets  . Menopause   . Anxiety     chronic  . Arthritis   . Hypertension   . Hyperlipidemia   . Thyroid disease     MNG S/P total lthyroidectomy  . Basal cell carcinoma of skin   . Hypercalcemia    Past Surgical History  Procedure Laterality Date  . Total thyroidectomy    . Abdominal hysterectomy  2005  . Bilateral salpingoophorectomy  2005   History   Social History  . Marital Status: Married    Spouse Name: Bill    Number of Children: 0  . Years of Education: N/A   Occupational History  . VP    Social History Main Topics  . Smoking status: Former Smoker -- 0.25 packs/day for 10 years    Quit date: 09/22/2004  . Smokeless tobacco: Never Used  . Alcohol Use: 1.2 oz/week    2 Glasses of wine per week     Comment: weekly  . Drug Use: No  . Sexual Activity: Yes    Birth Control/ Protection: Surgical   Other Topics Concern  . Not on file   Social History Narrative   2 Step Daughters   Hobby - scuba diving         Family History  Problem Relation Age of Onset   . Cancer Mother     head and neck  . Other Mother     aortic valve replacement  . Hypertension Mother   . Arthritis Mother   . Osteoporosis Mother   . Other Sister     tachycardia  . Cancer Maternal Grandmother     Colon  . Cancer Maternal Grandfather     Lung   Patient Active Problem List   Diagnosis Date Noted  . Biliary colic 07/14/2013  . 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 S/P hysterectomy 03/30/2012  . 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 Hypercalcemia 07/31/2011  . Gallstones 07/28/2011  . Anxiety   . Arthritis   . Hyperlipidemia   . Basal cell carcinoma of skin   . ANA positive   . Menopause   . PARESTHESIA 12/05/2010  . HOT FLASHES 12/05/2010  . HYPOTHYROIDISM 06/07/2010  . 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 GOITER, MULTINODULAR 01/03/2010  . THYROID NODULE, LEFT 12/26/2009  . PERIORBITAL CELLULITIS 12/24/2009  . DYSFUNCTION OF EUSTACHIAN TUBE 12/14/2009  . HAND PAIN, RIGHT 12/14/2009  .  PALPITATIONS, OCCASIONAL 08/14/2009  . HYPERGLYCEMIA 05/16/2009  . GLUCOCORTICOID DEFICIENCY 04/19/2009  . Sarcoidosis 03/22/2009  . HYPERTENSION 03/22/2009   Current Outpatient Prescriptions on File Prior to Visit  Medication Sig Dispense Refill  . acyclovir (ZOVIRAX) 400 MG tablet Take one tablet bid  180 tablet  0  . bisoprolol (ZEBETA) 5 MG tablet Take 1 tablet (5 mg total) by mouth daily. Take 1/2 tablet daily  30 tablet  11  . estradiol (VIVELLE-DOT) 0.075 MG/24HR Place 1 patch onto the skin 2 (two) times a week.  8 patch  6  . levothyroxine (SYNTHROID, LEVOTHROID) 125 MCG tablet Take 1 tablet (125 mcg total) by mouth daily.  90 tablet  3  . oxyCODONE-acetaminophen (PERCOCET/ROXICET) 5-325 MG per tablet Take 2 tablets by mouth every 4 (four) hours as needed for pain.  6 tablet  0   No current facility-administered medications on file prior to visit.     Review of Systems    see HPI Objective:   Physical Exam Physical Exam  Nursing note and vitals reviewed.   Constitutional: She is oriented to person, place, and time. She appears well-developed and well-nourished.  HENT:  Head: Normocephalic and atraumatic.  Cardiovascular: Normal rate and regular rhythm. Exam reveals no gallop and no friction rub.  No murmur heard.  Pulmonary/Chest: Breath sounds normal. She has no wheezes. She has no rales. Abd  Soft  Pain to deep palpation  RUQ  No peritoneal signs  Neurological: She is alert and oriented to person, place, and time.  Skin: Skin is warm and dry.  Psychiatric: She has a normal mood and affect. Her behavior is normal.             Assessment & Plan:   Biliary colic  Will refer to surgeon   Dr.  Abbey Chatters  Irritability  Gave number to Both  Dr. Evelene Croon and Dr. Nolen Mu  Pt to call for appt  Weight loss intentional    HtN  Pt to see me after her surgery and may be able to come off her antihypertensive

## 2013-07-14 NOTE — Patient Instructions (Signed)
DR.  Evelene Croon  [phone]  Dr. Emerson Monte  [phone]  Will refer to surgeon  Dr. Abbey Chatters  See me in 6 weeks  To evaluate blood pressure

## 2013-07-18 NOTE — Telephone Encounter (Signed)
error 

## 2013-07-21 ENCOUNTER — Encounter: Payer: Self-pay | Admitting: *Deleted

## 2013-07-28 ENCOUNTER — Encounter (INDEPENDENT_AMBULATORY_CARE_PROVIDER_SITE_OTHER): Payer: Self-pay | Admitting: General Surgery

## 2013-07-28 ENCOUNTER — Encounter (INDEPENDENT_AMBULATORY_CARE_PROVIDER_SITE_OTHER): Payer: Self-pay

## 2013-07-28 ENCOUNTER — Ambulatory Visit (INDEPENDENT_AMBULATORY_CARE_PROVIDER_SITE_OTHER): Payer: BC Managed Care – PPO | Admitting: General Surgery

## 2013-07-28 VITALS — BP 118/72 | HR 64 | Temp 97.2°F | Resp 14 | Ht 70.0 in | Wt 178.0 lb

## 2013-07-28 DIAGNOSIS — K8020 Calculus of gallbladder without cholecystitis without obstruction: Secondary | ICD-10-CM

## 2013-07-28 NOTE — Patient Instructions (Signed)
[facility] ______[facility] °LAPAROSCOPIC SURGERY: POST OP INSTRUCTIONS °Always review your discharge instruction sheet given to you by the facility where your surgery was performed. °IF YOU HAVE DISABILITY OR FAMILY LEAVE FORMS, YOU MUST BRING THEM TO THE OFFICE FOR PROCESSING.   °DO NOT GIVE THEM TO YOUR DOCTOR. ° °1. A prescription for pain medication may be given to you upon discharge.  Take your pain medication as prescribed, if needed.  If narcotic pain medicine is not needed, then you may take acetaminophen (Tylenol) or ibuprofen (Advil) as needed. °2. Take your usually prescribed medications unless otherwise directed. °3. If you need a refill on your pain medication, please contact your pharmacy.  They will contact our office to request authorization. Prescriptions will not be filled after 5pm or on week-ends. °4. You should follow a light diet the first few days after arrival home, such as soup and crackers, etc.  Be sure to include lots of fluids daily. °5. Most patients will experience some swelling and bruising in the area of the incisions.  Ice packs will help.  Swelling and bruising can take several days to resolve.  °6. It is common to experience some constipation if taking pain medication after surgery.  Increasing fluid intake and taking a stool softener (such as Colace) will usually help or prevent this problem from occurring.  A mild laxative (Milk of Magnesia or Miralax) should be taken according to package instructions if there are no bowel movements after 48 hours. °7. Unless discharge instructions indicate otherwise, you may remove your bandages 24-48 hours after surgery, and you may shower at that time.  You may have steri-strips (small skin tapes) in place directly over the incision.  These strips should be left on the skin for 7-10 days.  If your surgeon used skin glue on the incision, you may shower in 24 hours.  The glue will flake off over the next 2-3 weeks.  Any sutures or  staples will be removed at the office during your follow-up visit. °8. ACTIVITIES:  You may resume regular (light) daily activities beginning the next day--such as daily self-care, walking, climbing stairs--gradually increasing activities as tolerated.  You may have sexual intercourse when it is comfortable.  Refrain from any heavy lifting or straining until approved by your doctor. °a. You may drive when you are no longer taking prescription pain medication, you can comfortably wear a seatbelt, and you can safely maneuver your car and apply brakes. °b. RETURN TO WORK:  __________________________________________________________ °9. You should see your doctor in the office for a follow-up appointment approximately 2-3 weeks after your surgery.  Make sure that you call for this appointment within a day or two after you arrive home to insure a convenient appointment time. °10. OTHER INSTRUCTIONS: __________________________________________________________________________________________________________________________ __________________________________________________________________________________________________________________________ °WHEN TO CALL YOUR DOCTOR: °1. Fever over 101.0 °2. Inability to urinate °3. Continued bleeding from incision. °4. Increased pain, redness, or drainage from the incision. °5. Increasing abdominal pain ° °The clinic staff is available to answer your questions during regular business hours.  Please don’t hesitate to call and ask to speak to one of the nurses for clinical concerns.  If you have a medical emergency, go to the nearest emergency room or call 911.  A surgeon from [facility] is always on call at the hospital. °[address], [address], [place], [place]  [address] ? [address] [address], [place], [place]   [address] °[phone] ? [phone] ? FAX [phone] °Web site:   [email or web address] °

## 2013-07-28 NOTE — Progress Notes (Signed)
Patient ID: Shannon Wright, female   DOB: 11-07-1955, 57 y.o.   MRN: [id number]  Chief Complaint  Patient presents with  . Cholelithiasis    HPI Shannon Wright is a 57 y.o. female.   HPI  She is referred by Dr. Constance Goltz for further evaluation and treatment of biliary colic. She had severe substernal chest pain radiating through to the back associated with nausea and vomiting. She went to a local emergency department. Cardiac etiology was ruled out. Ultrasound demonstrated multiple gallstones. In hindsight, she's had small twinges of pain in her right upper quadrant in the past. She has had known gallstones. These were found incidentally on ultrasound a number of years ago. This is her first case of significant symptoms from them.  Past Medical History  Diagnosis Date  . Sarcoidosis   . Right knee meniscal tear   . ANA positive   . Genital ulcer, female     question Bechets  . Menopause   . Anxiety     chronic  . Arthritis   . Hypertension   . Hyperlipidemia   . Thyroid disease     MNG S/P total lthyroidectomy  . Basal cell carcinoma of skin   . Hypercalcemia     Past Surgical History  Procedure Laterality Date  . Total thyroidectomy  04/2010  . Abdominal hysterectomy  2005  . Bilateral salpingoophorectomy  2005    Family History  Problem Relation Age of Onset  . Cancer Mother     head and neck  . Other Mother     aortic valve replacement  . Hypertension Mother   . Arthritis Mother   . Osteoporosis Mother   . Other Sister     tachycardia  . Cancer Maternal Grandmother     Colon  . Cancer Maternal Grandfather     Lung    Social History History  Substance Use Topics  . Smoking status: Former Smoker -- 0.25 packs/day for 10 years    Quit date: 09/22/2004  . Smokeless tobacco: Never Used  . Alcohol Use: No    No Known Allergies  Current Outpatient Prescriptions  Medication Sig Dispense Refill  . acyclovir (ZOVIRAX) 400 MG tablet Take one tablet  bid  180 tablet  0  . bisoprolol (ZEBETA) 5 MG tablet Take 1 tablet (5 mg total) by mouth daily. Take 1/2 tablet daily  30 tablet  11  . estradiol (VIVELLE-DOT) 0.075 MG/24HR Place 1 patch onto the skin 2 (two) times a week.  8 patch  6  . levothyroxine (SYNTHROID, LEVOTHROID) 125 MCG tablet Take 1 tablet (125 mcg total) by mouth daily.  90 tablet  3  . omeprazole (PRILOSEC) 20 MG capsule        No current facility-administered medications for this visit.    Review of Systems Review of Systems  Constitutional: Negative.   HENT: Negative.   Respiratory: Negative.   Cardiovascular: Negative.   Gastrointestinal: Positive for nausea, abdominal pain and constipation.  Genitourinary: Negative.   Neurological: Negative.     Blood pressure 118/72, pulse 64, temperature 97.2 F (36.2 C), temperature source Temporal, resp. rate 14, height 5\' 10"  (1.778 m), weight 178 lb (80.74 kg).  Physical Exam Physical Exam  Constitutional: She appears well-developed and well-nourished. No distress.  HENT:  Head: Normocephalic and atraumatic.  Eyes: EOM are normal. No scleral icterus.  Neck: Neck supple.  Lower transverse scar  Cardiovascular: Normal rate and regular rhythm.   Pulmonary/Chest: Effort normal and breath sounds normal.  Abdominal: Soft. She exhibits mass. She exhibits no distension. There is no tenderness.  Small subumbilical scar. Lower transverse scar. No palpable hernias.  Musculoskeletal: She exhibits no edema.  Neurological: She is alert.  Skin: Skin is warm and dry.  Psychiatric: She has a normal mood and affect. Her behavior is normal.    Data Reviewed Office note from primary care physician. Ultrasound report.  Assessment    Symptomatic cholelithiasis. We talked about nonoperative management versus laparoscopic cholecystectomy. I told her there is a chance of her having another episode and so I recommended cholecystectomy and she seems to be in agreement with this.      Plan    Laparoscopic cholecystectomy with cholangiogram.  I have explained the procedure, risks, and aftercare of cholecystectomy.  Risks include but are not limited to bleeding, infection, wound problems, anesthesia, diarrhea, bile leak, injury to common bile duct/liver/intestine.  She seems to understand and agrees to proceed.         Mateya Torti J 07/28/2013, 2:10 PM

## 2013-08-09 ENCOUNTER — Telehealth (INDEPENDENT_AMBULATORY_CARE_PROVIDER_SITE_OTHER): Payer: Self-pay | Admitting: General Surgery

## 2013-08-09 NOTE — Telephone Encounter (Signed)
Pt sister had accident she needs to cancel surgery / will call back to reschedule / please refund her deposit

## 2013-08-09 NOTE — Telephone Encounter (Signed)
Noted  

## 2013-08-10 ENCOUNTER — Encounter (INDEPENDENT_AMBULATORY_CARE_PROVIDER_SITE_OTHER): Payer: Self-pay

## 2013-08-10 ENCOUNTER — Other Ambulatory Visit (HOSPITAL_COMMUNITY): Payer: BC Managed Care – PPO

## 2013-08-11 ENCOUNTER — Other Ambulatory Visit: Payer: Self-pay | Admitting: *Deleted

## 2013-08-11 ENCOUNTER — Telehealth: Payer: Self-pay | Admitting: *Deleted

## 2013-08-11 DIAGNOSIS — R922 Inconclusive mammogram: Secondary | ICD-10-CM

## 2013-08-11 DIAGNOSIS — Z139 Encounter for screening, unspecified: Secondary | ICD-10-CM

## 2013-08-11 NOTE — Telephone Encounter (Signed)
Pt requested an appt from mychart regarding her MM. LVM message to return call.

## 2013-08-15 ENCOUNTER — Other Ambulatory Visit: Payer: Self-pay | Admitting: Internal Medicine

## 2013-08-16 NOTE — Telephone Encounter (Signed)
Refill request

## 2013-08-22 ENCOUNTER — Ambulatory Visit
Admission: RE | Admit: 2013-08-22 | Discharge: 2013-08-22 | Disposition: A | Discharge status: Home / Self Care | Payer: BC Managed Care – PPO | Source: Ambulatory Visit | Attending: Internal Medicine | Admitting: Internal Medicine

## 2013-08-22 DIAGNOSIS — Z139 Encounter for screening, unspecified: Secondary | ICD-10-CM

## 2013-08-22 DIAGNOSIS — R922 Inconclusive mammogram: Secondary | ICD-10-CM

## 2013-08-23 ENCOUNTER — Ambulatory Visit: Payer: BC Managed Care – PPO | Admitting: Internal Medicine

## 2013-08-23 ENCOUNTER — Encounter (HOSPITAL_COMMUNITY): Admission: RE | Payer: Self-pay | Source: Ambulatory Visit

## 2013-08-23 ENCOUNTER — Ambulatory Visit (HOSPITAL_COMMUNITY): Admission: RE | Admit: 2013-08-23 | Payer: BC Managed Care – PPO | Source: Ambulatory Visit | Admitting: General Surgery

## 2013-08-23 SURGERY — LAPAROSCOPIC CHOLECYSTECTOMY WITH INTRAOPERATIVE CHOLANGIOGRAM
Anesthesia: General

## 2013-08-25 ENCOUNTER — Other Ambulatory Visit: Payer: Self-pay | Admitting: Internal Medicine

## 2013-08-25 DIAGNOSIS — R928 Other abnormal and inconclusive findings on diagnostic imaging of breast: Secondary | ICD-10-CM

## 2013-08-31 ENCOUNTER — Ambulatory Visit
Admission: RE | Admit: 2013-08-31 | Discharge: 2013-08-31 | Disposition: A | Discharge status: Home / Self Care | Payer: BC Managed Care – PPO | Source: Ambulatory Visit | Attending: Internal Medicine | Admitting: Internal Medicine

## 2013-08-31 ENCOUNTER — Encounter: Payer: Self-pay | Admitting: Internal Medicine

## 2013-08-31 DIAGNOSIS — R928 Other abnormal and inconclusive findings on diagnostic imaging of breast: Secondary | ICD-10-CM

## 2013-09-05 ENCOUNTER — Other Ambulatory Visit: Payer: BC Managed Care – PPO

## 2013-10-13 ENCOUNTER — Telehealth: Payer: Self-pay | Admitting: *Deleted

## 2013-10-13 DIAGNOSIS — N951 Menopausal and female climacteric states: Secondary | ICD-10-CM

## 2013-10-13 MED ORDER — ESTRADIOL 0.075 MG/24HR TD PTTW: 1.0000 | MEDICATED_PATCH | TRANSDERMAL | Status: DC

## 2013-10-13 NOTE — Telephone Encounter (Signed)
Needs refill estradiol (VIVELLE-DOT) 0.075 MG/24HR

## 2013-10-24 ENCOUNTER — Ambulatory Visit (INDEPENDENT_AMBULATORY_CARE_PROVIDER_SITE_OTHER): Payer: BC Managed Care – PPO | Admitting: Internal Medicine

## 2013-10-24 ENCOUNTER — Encounter: Payer: Self-pay | Admitting: Internal Medicine

## 2013-10-24 VITALS — BP 143/91 | HR 86 | Temp 98.6°F | Resp 20 | Wt 180.0 lb

## 2013-10-24 DIAGNOSIS — I10 Essential (primary) hypertension: Secondary | ICD-10-CM

## 2013-10-24 DIAGNOSIS — R059 Cough, unspecified: Secondary | ICD-10-CM

## 2013-10-24 DIAGNOSIS — D472 Monoclonal gammopathy: Secondary | ICD-10-CM | POA: Insufficient documentation

## 2013-10-24 DIAGNOSIS — R05 Cough: Secondary | ICD-10-CM

## 2013-10-24 DIAGNOSIS — D869 Sarcoidosis, unspecified: Secondary | ICD-10-CM

## 2013-10-24 DIAGNOSIS — J209 Acute bronchitis, unspecified: Secondary | ICD-10-CM

## 2013-10-24 MED ORDER — AZITHROMYCIN 250 MG PO TABS: ORAL_TABLET | ORAL | Status: DC

## 2013-10-24 MED ORDER — HYDROCOD POLST-CHLORPHEN POLST 10-8 MG/5ML PO LQCR: 5.0000 mL | Freq: Two times a day (BID) | ORAL | Status: DC | PRN

## 2013-10-24 MED ORDER — METHYLPREDNISOLONE ACETATE 80 MG/ML IJ SUSP
160.0000 mg | Freq: Once | INTRAMUSCULAR | Status: AC
  Administered 2013-10-24: 160 mg via INTRAMUSCULAR

## 2013-10-24 MED ORDER — TRIAMTERENE-HCTZ 50-25 MG PO CAPS: 1.0000 | ORAL_CAPSULE | ORAL | Status: DC

## 2013-10-24 NOTE — Addendum Note (Signed)
Addended by: Conley Rolls on: 10/24/2013 01:03 PM   Modules accepted: Orders

## 2013-10-24 NOTE — Progress Notes (Signed)
Subjective:    Patient ID: Shannon Wright, female    DOB: 01-03-1956, 58 y.o.   MRN: [id number]  HPI Shannon Wright is here with acute visit.  Several days of chest congestion and cough productive of yellow mucous.  She denies wheezing,  Chest pain, no fever no sore throat.  She does have a history of sarcoid.    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  She stopped her BP meds and tells me her BP at home has been in the 150's   No Known Allergies Past Medical History  Diagnosis Date  . Sarcoidosis   . Right knee meniscal tear   . ANA positive   . Genital ulcer, female     question Bechets  . Menopause   . Anxiety     chronic  . Arthritis   . Hypertension   . Hyperlipidemia   . Thyroid disease     MNG S/P total lthyroidectomy  . Basal cell carcinoma of skin   . Hypercalcemia    Past Surgical History  Procedure Laterality Date  . Total thyroidectomy  04/2010  . Abdominal hysterectomy  2005  . Bilateral salpingoophorectomy  2005   History   Social History  . Marital Status: Married    Spouse Name: Bill    Number of Children: 0  . Years of Education: N/A   Occupational History  . VP    Social History Main Topics  . Smoking status: Former Smoker -- 0.25 packs/day for 10 years    Quit date: 09/22/2004  . Smokeless tobacco: Never Used  . Alcohol Use: No  . Drug Use: No  . Sexual Activity: Yes    Birth Control/ Protection: Surgical   Other Topics Concern  . Not on file   Social History Narrative   2 Step Daughters   Hobby - scuba diving         Family History  Problem Relation Age of Onset  . Cancer Mother     head and neck  . Other Mother     aortic valve replacement  . Hypertension Mother   . Arthritis Mother   . Osteoporosis Mother   . Other Sister     tachycardia  . Cancer Maternal  Grandmother     Colon  . Cancer Maternal Grandfather     Lung   Patient Active Problem List   Diagnosis Date Noted  . Symptomatic cholelithiasis 07/28/2013  . Biliary colic 09/24/7251  . 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 S/P hysterectomy 03/30/2012  . 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 Hypercalcemia 07/31/2011  . Gallstones 07/28/2011  . Anxiety   . Arthritis   . Hyperlipidemia   . Basal cell carcinoma of skin   . ANA positive   . Menopause   . PARESTHESIA 12/05/2010  . HOT FLASHES 12/05/2010  . HYPOTHYROIDISM 06/07/2010  . 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 GOITER, MULTINODULAR 01/03/2010  . THYROID NODULE, LEFT 12/26/2009  . PERIORBITAL CELLULITIS 12/24/2009  . DYSFUNCTION OF EUSTACHIAN TUBE 12/14/2009  . HAND PAIN, RIGHT 12/14/2009  . PALPITATIONS, OCCASIONAL 08/14/2009  . HYPERGLYCEMIA 05/16/2009  . GLUCOCORTICOID DEFICIENCY 04/19/2009  . Sarcoidosis 03/22/2009  . HYPERTENSION 03/22/2009   Current Outpatient Prescriptions on File Prior to Visit  Medication Sig Dispense Refill  . acyclovir (ZOVIRAX) 400 MG tablet TAKE  1 TABLET BY MOUTH TWICE DAILY  180 tablet  1  . estradiol (VIVELLE-DOT) 0.075 MG/24HR Place 1 patch onto the skin 2 (two) times a week.  8 patch  1  . levothyroxine (SYNTHROID, LEVOTHROID) 125 MCG tablet Take 1 tablet (125 mcg total) by mouth daily.  90 tablet  3  . bisoprolol (ZEBETA) 5 MG tablet Take 1 tablet (5 mg total) by mouth daily. Take 1/2 tablet daily  30 tablet  11  . omeprazole (PRILOSEC) 20 MG capsule        No current facility-administered medications on file prior to visit.       Review of Systems See HPI    Objective:   Physical Exam Physical Exam  Nursing note and vitals reviewed.  Constitutional: She is oriented to person, place, and time. She appears well-developed and well-nourished. She is cooperative.  HENT:  Head: Normocephalic and atraumatic.  Nose: Mucosal edema present.  Eyes: Conjunctivae and EOM are normal. Pupils are  equal, round, and reactive to light.  Neck: Neck supple.  Cardiovascular: Regular rhythm, normal heart sounds, intact distal pulses and normal pulses. Exam reveals no gallop and no friction rub.  No murmur heard.  Pulmonary/Chest: She has no wheezes. She has rhonchi. She has no rales.  Neurological: She is alert and oriented to person, place, and time.  Skin: Skin is warm and dry. No abrasion, no bruising, no ecchymosis and no rash noted. No cyanosis. Nails show no clubbing.  EXT  No edema Psychiatric: She has a normal mood and affect. Her speech is normal and behavior is normal.           Assessment & Plan:  Bronchitis in setting of sarcoid  Will give Z-pak  And depomedrol  160 mg in office  Cough  Tussionex and see above  HTN will start triamterine/hctz one daily  Sarcoid  See above  MGUS  Hematology appt pending  She has appt with me in 2 weeks

## 2013-10-24 NOTE — Patient Instructions (Signed)
See me in 2 weeks at your regular appt

## 2013-10-25 ENCOUNTER — Telehealth: Payer: Self-pay | Admitting: *Deleted

## 2013-10-25 ENCOUNTER — Other Ambulatory Visit: Payer: Self-pay | Admitting: *Deleted

## 2013-10-25 MED ORDER — ALBUTEROL SULFATE HFA 108 (90 BASE) MCG/ACT IN AERS: 2.0000 | INHALATION_SPRAY | Freq: Three times a day (TID) | RESPIRATORY_TRACT | Status: DC

## 2013-10-25 NOTE — Telephone Encounter (Signed)
Shannon Wright called regarding an inhaler states that she was supposed to receive a RX and didn't

## 2013-11-07 ENCOUNTER — Ambulatory Visit (HOSPITAL_BASED_OUTPATIENT_CLINIC_OR_DEPARTMENT_OTHER)
Admission: RE | Admit: 2013-11-07 | Discharge: 2013-11-07 | Disposition: A | Discharge status: Home / Self Care | Payer: BC Managed Care – PPO | Source: Ambulatory Visit | Attending: Internal Medicine | Admitting: Internal Medicine

## 2013-11-07 ENCOUNTER — Encounter: Payer: Self-pay | Admitting: Internal Medicine

## 2013-11-07 ENCOUNTER — Ambulatory Visit (INDEPENDENT_AMBULATORY_CARE_PROVIDER_SITE_OTHER): Payer: BC Managed Care – PPO | Admitting: Internal Medicine

## 2013-11-07 VITALS — BP 122/79 | HR 65 | Resp 16 | Ht 70.0 in | Wt 175.0 lb

## 2013-11-07 DIAGNOSIS — Z0000 Encounter for general adult medical examination without abnormal findings: Secondary | ICD-10-CM

## 2013-11-07 DIAGNOSIS — E785 Hyperlipidemia, unspecified: Secondary | ICD-10-CM

## 2013-11-07 DIAGNOSIS — D869 Sarcoidosis, unspecified: Secondary | ICD-10-CM

## 2013-11-07 DIAGNOSIS — I10 Essential (primary) hypertension: Secondary | ICD-10-CM

## 2013-11-07 DIAGNOSIS — D472 Monoclonal gammopathy: Secondary | ICD-10-CM

## 2013-11-07 DIAGNOSIS — E039 Hypothyroidism, unspecified: Secondary | ICD-10-CM

## 2013-11-07 LAB — POCT URINALYSIS DIPSTICK
Bilirubin, UA: NEGATIVE
Blood, UA: NEGATIVE
Glucose, UA: NEGATIVE
Ketones, UA: NEGATIVE
Leukocytes, UA: NEGATIVE
Nitrite, UA: NEGATIVE
PROTEIN UA: NEGATIVE
SPEC GRAV UA: 1.01
UROBILINOGEN UA: 0.2
pH, UA: 6.5

## 2013-11-07 LAB — LIPID PANEL
Cholesterol: 238 mg/dL — ABNORMAL HIGH (ref 0–200)
HDL: 66 mg/dL (ref >39)
LDL Cholesterol: 146 mg/dL — ABNORMAL HIGH (ref 0–99)
TRIGLYCERIDES: 132 mg/dL (ref <150)
Total CHOL/HDL Ratio: 3.6 Ratio
VLDL: 26 mg/dL (ref 0–40)

## 2013-11-07 NOTE — Progress Notes (Signed)
Subjective:    Patient ID: Shannon Wright, female    DOB: September 05, 1956, 58 y.o.   MRN: [id number]  HPI  Shannon Wright is here for CPE.    Overall doing well.  She has been evaluated at [facility] for peripheral paresthesias in both feet.  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   She is negative for light chains.  She has upcoming appt with oncology at [place]  No SOB or pulmonary symtoms.  Most recent calcium done 09/2013 at [facility] was normal  Symptomatic menopause.   Hot flushes decreasing in frequency and she would like to come off her HT  She has dermatologist who does skin surveillance  -  Basal cell skin CA in past.    She reports tobacco use for approx 8-10 years less than 1ppd.  Quit over 15 years ago  Shannon Wright had one episode of symptomatic cholelithiasis and opted not to have surgery.      No Known Allergies Past Medical History  Diagnosis Date  . Sarcoidosis   . Right knee meniscal tear   . ANA positive   . Genital ulcer, female     question Bechets  . Menopause   . Anxiety     chronic  . Arthritis   . Hypertension   . Hyperlipidemia   . Thyroid disease     MNG S/P total lthyroidectomy  . Basal cell carcinoma of skin   . Hypercalcemia    Past Surgical History  Procedure Laterality Date  . Total thyroidectomy  04/2010  . Abdominal hysterectomy  2005  . Bilateral salpingoophorectomy  2005   History   Social History  . Marital Status: Married    Spouse Name: Bill    Number of Children: 0  . Years of Education: N/A   Occupational History  . VP    Social History Main Topics  . Smoking status: Former Smoker -- 0.25 packs/day for 10 years    Quit date: 09/22/2004  . Smokeless tobacco: Never Used  . Alcohol Use: No  . Drug Use: No  . Sexual Activity: Yes    Birth Control/ Protection: Surgical   Other Topics Concern  . Not on file   Social History Narrative   2 Step Daughters   Hobby - scuba diving         Family History   Problem Relation Age of Onset  . Cancer Mother     head and neck  . Other Mother     aortic valve replacement  . Hypertension Mother   . Arthritis Mother   . Osteoporosis Mother   . Other Sister     tachycardia  . Cancer Maternal Grandmother     Colon  . Cancer Maternal Grandfather     Lung   Patient Active Problem List   Diagnosis Date Noted  . MGUS (monoclonal gammopathy of unknown significance) 10/24/2013  . Symptomatic cholelithiasis 07/28/2013  . Biliary colic [id number]  . 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 S/P hysterectomy 03/30/2012  . 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 Hypercalcemia 07/31/2011  . Gallstones 07/28/2011  . Anxiety   . Arthritis   . Hyperlipidemia   . Basal cell carcinoma of skin   . ANA positive   . Menopause   . PARESTHESIA 12/05/2010  . HOT FLASHES 12/05/2010  . HYPOTHYROIDISM 06/07/2010  . 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 GOITER, MULTINODULAR 01/03/2010  . THYROID NODULE, LEFT 12/26/2009  . PERIORBITAL CELLULITIS 12/24/2009  . DYSFUNCTION OF  EUSTACHIAN TUBE 12/14/2009  . HAND PAIN, RIGHT 12/14/2009  . PALPITATIONS, OCCASIONAL 08/14/2009  . HYPERGLYCEMIA 05/16/2009  . GLUCOCORTICOID DEFICIENCY 04/19/2009  . Sarcoidosis 03/22/2009  . HYPERTENSION 03/22/2009   Current Outpatient Prescriptions on File Prior to Visit  Medication Sig Dispense Refill  . acyclovir (ZOVIRAX) 400 MG tablet TAKE 1 TABLET BY MOUTH TWICE DAILY  180 tablet  1  . estradiol (VIVELLE-DOT) 0.075 MG/24HR Place 1 patch onto the skin 2 (two) times a week.  8 patch  1  . gabapentin (NEURONTIN) 100 MG capsule Take 100 mg by mouth 3 (three) times daily.      Marland Kitchen levothyroxine (SYNTHROID, LEVOTHROID) 125 MCG tablet Take 1 tablet (125 mcg total) by mouth daily.  90 tablet  3  . triamterene-hydrochlorothiazide (DYAZIDE) 50-25 MG per capsule Take 1 capsule by mouth every morning.  30 capsule  2  . albuterol (PROVENTIL HFA;VENTOLIN HFA) 108 (90 BASE) MCG/ACT inhaler Inhale 2 puffs into the  lungs every 8 (eight) hours.  1 Inhaler  0  . chlorpheniramine-HYDROcodone (TUSSIONEX PENNKINETIC ER) 10-8 MG/5ML LQCR Take 5 mLs by mouth every 12 (twelve) hours as needed for cough.  240 mL  0   No current facility-administered medications on file prior to visit.      Review of Systems    see HPI Objective:   Physical Exam Physical Exam  Nursing note and vitals reviewed.  Constitutional: She is oriented to person, place, and time. She appears well-developed and well-nourished.  HENT:  Head: Normocephalic and atraumatic.  Right Ear: Tympanic membrane and ear canal normal. No drainage. Tympanic membrane is not injected and not erythematous.  Left Ear: Tympanic membrane and ear canal normal. No drainage. Tympanic membrane is not injected and not erythematous.  Nose: Nose normal. Right sinus exhibits no maxillary sinus tenderness and no frontal sinus tenderness. Left sinus exhibits no maxillary sinus tenderness and no frontal sinus tenderness.  Mouth/Throat: Oropharynx is clear and moist. No oral lesions. No oropharyngeal exudate.  Eyes: Conjunctivae and EOM are normal. Pupils are equal, round, and reactive to light.  Neck: Normal range of motion. Neck supple. No JVD present. Carotid bruit is not present. No mass and no thyromegaly present.  Cardiovascular: Normal rate, regular rhythm, S1 normal, S2 normal and intact distal pulses. Exam reveals no gallop and no friction rub.  No murmur heard.  Pulses:  Carotid pulses are 2+ on the right side, and 2+ on the left side.  Dorsalis pedis pulses are 2+ on the right side, and 2+ on the left side.  No carotid bruit. No LE edema  Pulmonary/Chest: Breath sounds normal. She has no wheezes. She has no rales. She exhibits no tenderness.  Breast no discrete mass no nipple discharge no axilllary adenopathy bilaterally Abdominal: Soft. Bowel sounds are normal. She exhibits no distension and no mass. There is no hepatosplenomegaly. There is no  tenderness. There is no CVA tenderness.   Rectal no mass guaiac neg Musculoskeletal: Normal range of motion.  No active synovitis to joints.  Lymphadenopathy:  She has no cervical adenopathy.  She has no axillary adenopathy.  Right: No inguinal and no supraclavicular adenopathy present.  Left: No inguinal and no supraclavicular adenopathy present.  Neurological: She is alert and oriented to person, place, and time. She has normal strength and normal reflexes. She displays no tremor. No cranial nerve deficit or sensory deficit. Coordination and gait normal.  Skin: Skin is warm and dry. No rash noted. No cyanosis. Nails show no clubbing.  Psychiatric: She has a normal mood and affect. Her speech is normal and behavior is normal. Cognition and memory are normal.           Assessment & Plan:  Health Maintenance  :  UTD with MM,  Discussed Zostavax,  S/P hysterectomy and BSO,  She does not meet guideline criteria for Chest CT former smoker.  It has been 10 years since colonoscopy.  Advised to schedule a appt with her GI MD.  Hyperlipidemia  Will check today  Sarcoid  Asymptomatic  Normal Calcium  Will get CXR today  Peripheral paresthesias/abormal IFE  Pending heme/onc appt.   Symptomatic hot fluses.  Will begin estradiol taper.  Sheis to take 1/2 Vivelle patch 2 times a week for 4 weeks then 1/4 patch for 4 weeks then stop.   Cholelithiasis  Advised if another painful episode,  She is have cholecystectomy.  She has seen Dr. Zella Richer .  Anxiety  See me as needed

## 2013-11-07 NOTE — Patient Instructions (Signed)
To have xray today  See me as needed

## 2013-11-08 LAB — TSH: TSH: 2.91 u[IU]/mL (ref 0.350–4.500)

## 2013-11-08 LAB — VITAMIN D 25 HYDROXY (VIT D DEFICIENCY, FRACTURES): Vit D, 25-Hydroxy: 68 ng/mL (ref 30–89)

## 2013-11-09 ENCOUNTER — Encounter: Payer: Self-pay | Admitting: *Deleted

## 2013-11-09 ENCOUNTER — Encounter: Payer: Self-pay | Admitting: Internal Medicine

## 2013-11-09 ENCOUNTER — Telehealth: Payer: Self-pay | Admitting: *Deleted

## 2013-11-09 NOTE — Telephone Encounter (Signed)
LVM message regarding her chest x ray

## 2013-11-09 NOTE — Telephone Encounter (Signed)
Message copied by Conley Rolls on Wed Nov 09, 2013 11:55 AM ------      Message from: Emi Belfast D      Created: Tue Nov 08, 2013 10:47 AM       Call pt and let her now that her CXR is stable no new activity ------

## 2013-11-22 ENCOUNTER — Ambulatory Visit: Payer: BC Managed Care – PPO | Admitting: Critical Care Medicine

## 2013-12-09 ENCOUNTER — Other Ambulatory Visit: Payer: Self-pay | Admitting: Internal Medicine

## 2013-12-09 NOTE — Telephone Encounter (Signed)
Refill request, CPE in Feb last MM 12/14

## 2013-12-13 ENCOUNTER — Ambulatory Visit: Payer: BC Managed Care – PPO | Admitting: Critical Care Medicine

## 2013-12-20 ENCOUNTER — Other Ambulatory Visit: Payer: Self-pay | Admitting: Internal Medicine

## 2013-12-21 NOTE — Telephone Encounter (Signed)
Refill request

## 2014-01-25 ENCOUNTER — Encounter: Payer: Self-pay | Admitting: Internal Medicine

## 2014-01-25 ENCOUNTER — Ambulatory Visit (INDEPENDENT_AMBULATORY_CARE_PROVIDER_SITE_OTHER): Payer: BC Managed Care – PPO | Admitting: Internal Medicine

## 2014-01-25 VITALS — BP 115/74 | HR 76 | Temp 97.8°F | Resp 18 | Wt 181.0 lb

## 2014-01-25 DIAGNOSIS — D8689 Sarcoidosis of other sites: Secondary | ICD-10-CM

## 2014-01-25 DIAGNOSIS — G63 Polyneuropathy in diseases classified elsewhere: Secondary | ICD-10-CM

## 2014-01-25 DIAGNOSIS — D472 Monoclonal gammopathy: Secondary | ICD-10-CM

## 2014-01-25 DIAGNOSIS — D869 Sarcoidosis, unspecified: Secondary | ICD-10-CM

## 2014-01-25 DIAGNOSIS — E785 Hyperlipidemia, unspecified: Secondary | ICD-10-CM

## 2014-01-25 LAB — LIPID PANEL
CHOLESTEROL: 226 mg/dL — AB (ref 0–200)
HDL: 58 mg/dL (ref >39)
LDL Cholesterol: 145 mg/dL — ABNORMAL HIGH (ref 0–99)
Total CHOL/HDL Ratio: 3.9 Ratio
Triglycerides: 116 mg/dL (ref <150)
VLDL: 23 mg/dL (ref 0–40)

## 2014-01-25 LAB — COMPREHENSIVE METABOLIC PANEL
ALT: 18 U/L (ref 0–35)
AST: 20 U/L (ref 0–37)
Albumin: 4.3 g/dL (ref 3.5–5.2)
Alkaline Phosphatase: 45 U/L (ref 39–117)
BUN: 16 mg/dL (ref 6–23)
CALCIUM: 9.1 mg/dL (ref 8.4–10.5)
CHLORIDE: 103 meq/L (ref 96–112)
CO2: 26 mEq/L (ref 19–32)
Creat: 0.97 mg/dL (ref 0.50–1.10)
Glucose, Bld: 80 mg/dL (ref 70–99)
Potassium: 4.1 mEq/L (ref 3.5–5.3)
Sodium: 139 mEq/L (ref 135–145)
Total Bilirubin: 0.4 mg/dL (ref 0.2–1.2)
Total Protein: 6.3 g/dL (ref 6.0–8.3)

## 2014-01-25 LAB — T4, FREE: Free T4: 1.35 ng/dL (ref 0.80–1.80)

## 2014-01-25 LAB — TSH: TSH: 1.026 u[IU]/mL (ref 0.350–4.500)

## 2014-01-25 LAB — T3, FREE: T3 FREE: 2.6 pg/mL (ref 2.3–4.2)

## 2014-01-25 NOTE — Progress Notes (Signed)
Subjective:    Patient ID: Shannon Wright, female    DOB: Dec 06, 1955, 58 y.o.   MRN: [id number]  HPI Shannon Wright is here for follow up   She has been evaluated at [facility] for peripheral neuropathy and possibility of MM -  No light changes oon SPE and Bone survey negative .  See note form Dr. Amalia Hailey.  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 Sarcoidosis   . Right knee meniscal tear   . ANA positive   . Genital ulcer, female     question Bechets  . Menopause   . Anxiety     chronic  . Arthritis   . Hypertension   . Hyperlipidemia   . Thyroid disease     MNG S/P total lthyroidectomy  . Basal cell carcinoma of skin   . Hypercalcemia    Past Surgical History  Procedure Laterality Date  . Total thyroidectomy  04/2010  . Abdominal hysterectomy  2005  . Bilateral salpingoophorectomy  2005   History   Social History  . Marital Status: Married    Spouse Name: Shannon Wright    Number of Children: 0  . Years of Education: N/A   Occupational History  . VP    Social History Main Topics  . Smoking status: Former Smoker -- 0.25 packs/day for 10 years    Quit date: 09/22/2004  . Smokeless tobacco: Never Used  . Alcohol Use: No  . Drug Use: No  . Sexual Activity: Yes    Birth Control/ Protection: Surgical   Other Topics Concern  . Not on file   Social History Narrative   2 Step Daughters   Hobby - scuba diving         Family History  Problem Relation Age of Onset  . Cancer Mother     head and neck  . Other Mother     aortic valve replacement  . Hypertension Mother   . Arthritis Mother   . Osteoporosis Mother   . Other Sister     tachycardia  . Cancer Maternal Grandmother     Colon  . Cancer Maternal Grandfather     Lung   Patient Active Problem List   Diagnosis Date Noted  .   MGUS  see note from Dr Rennis Petty at [facility] 10/24/2013  . Symptomatic cholelithiasis 07/28/2013  . Biliary colic 03/21/1600  . 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 S/P hysterectomy 03/30/2012  . 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 Hypercalcemia 07/31/2011  . Gallstones 07/28/2011  . Anxiety   . Arthritis   . Hyperlipidemia   . Basal cell carcinoma of skin   . ANA positive   . Menopause   . PARESTHESIA 12/05/2010  . HOT FLASHES 12/05/2010  . HYPOTHYROIDISM 06/07/2010  . 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 GOITER, MULTINODULAR 01/03/2010  . THYROID NODULE, LEFT 12/26/2009  . PERIORBITAL CELLULITIS 12/24/2009  . DYSFUNCTION OF EUSTACHIAN TUBE 12/14/2009  . HAND PAIN, RIGHT 12/14/2009  . PALPITATIONS, OCCASIONAL 08/14/2009  . HYPERGLYCEMIA 05/16/2009  . GLUCOCORTICOID DEFICIENCY 04/19/2009  . Sarcoidosis 03/22/2009  . HYPERTENSION 03/22/2009   Current Outpatient Prescriptions on File Prior to Visit  Medication Sig Dispense Refill  . ALPRAZolam (XANAX) 0.5 MG tablet       . estradiol (VIVELLE-DOT) 0.075 MG/24HR PLACE 1  PATCH ONTO THE SKIN TWICE A WEEK  8 patch  5  . gabapentin (NEURONTIN) 100 MG capsule Take 100 mg by mouth 3 (three) times daily.      Marland Kitchen levothyroxine (SYNTHROID, LEVOTHROID) 125 MCG tablet Take 1 tablet (125 mcg total) by mouth daily.  90 tablet  3  . PRISTIQ 50 MG 24 hr tablet       . triamterene-hydrochlorothiazide (DYAZIDE) 50-25 MG per capsule TAKE ONE CAPSULE BY MOUTH EVERY MORNING  90 capsule  0  . acyclovir (ZOVIRAX) 400 MG tablet TAKE 1 TABLET BY MOUTH TWICE DAILY  180 tablet  1   No current facility-administered medications on file prior to visit.       Review of Systems See HPI    Objective:   Physical Exam Physical Exam  Nursing note and vitals reviewed.  Constitutional: She is oriented to person, place, and time. She appears well-developed and well-nourished.  HENT:  Head: Normocephalic and atraumatic.  Cardiovascular: Normal rate and regular rhythm. Exam  reveals no gallop and no friction rub.  No murmur heard.  Pulmonary/Chest: Breath sounds normal. She has no wheezes. She has no rales.  Neurological: She is alert and oriented to person, place, and time.  Skin: Skin is warm and dry.  Psychiatric: She has a normal mood and affect. Her behavior is normal.         Assessment & Plan:  MGUS  Will have recheck q 54months with [facility]ology  Hyperlipidemia  Will recheck today  Sarocid with mild hypercalcemia  Recheck today

## 2014-01-25 NOTE — Patient Instructions (Signed)
See me prn 

## 2014-01-30 ENCOUNTER — Other Ambulatory Visit: Payer: Self-pay | Admitting: *Deleted

## 2014-01-30 ENCOUNTER — Telehealth: Payer: Self-pay | Admitting: *Deleted

## 2014-01-30 NOTE — Telephone Encounter (Signed)
Needs her triamterene-hydrochlorothiazide (DYAZIDE) 50-25 MG per capsule refilled at [facility] in [place].  She has been out since Friday.

## 2014-01-31 ENCOUNTER — Telehealth: Payer: Self-pay | Admitting: *Deleted

## 2014-01-31 ENCOUNTER — Other Ambulatory Visit: Payer: Self-pay | Admitting: *Deleted

## 2014-01-31 ENCOUNTER — Encounter: Payer: Self-pay | Admitting: Internal Medicine

## 2014-03-30 ENCOUNTER — Other Ambulatory Visit: Payer: Self-pay | Admitting: Internal Medicine

## 2014-05-02 ENCOUNTER — Other Ambulatory Visit: Payer: Self-pay | Admitting: Internal Medicine

## 2014-05-03 NOTE — Telephone Encounter (Signed)
Requested Medications     Medication name:  Name from pharmacy:  triamterene-hydrochlorothiazide (DYAZIDE) 50-25 MG per capsule  TRIAMTERENE 50MG  / HCTZ 25MG  CAPS    Sig: TAKE ONE CAPSULE BY MOUTH EVERY MORNING    Dispense: 90 capsule Refills: 0 Start: 05/02/2014  Class: Normal    Requested on: 05/02/2014    Originally ordered on: 10/24/2013 Last refill: 01/31/2014 Order History and Details

## 2014-05-04 ENCOUNTER — Other Ambulatory Visit: Payer: Self-pay | Admitting: Internal Medicine

## 2014-05-05 NOTE — Telephone Encounter (Signed)
Requested Medications     Medication name:  Name from pharmacy:  estradiol (VIVELLE-DOT) 0.075 MG/24HR  ESTRADIOL 0.075MG  PATCH (TWICE WK)    Sig: APPLY ONE PATCH ONTO THE SKIN TWICE WEEKLY AS DIRECTED    Dispense: 8 patch Refills: 0 Start: 05/04/2014  Class: Normal    Requested on: 05/04/2014    Originally ordered on: 09/01/2012 Last refill: 04/07/2014 Order History and Details

## 2014-06-26 ENCOUNTER — Other Ambulatory Visit: Payer: Self-pay | Admitting: *Deleted

## 2014-06-26 ENCOUNTER — Other Ambulatory Visit: Payer: Self-pay | Admitting: Internal Medicine

## 2014-06-26 NOTE — Telephone Encounter (Signed)
Refill request

## 2014-07-17 ENCOUNTER — Other Ambulatory Visit: Payer: Self-pay | Admitting: Internal Medicine

## 2014-07-17 NOTE — Telephone Encounter (Signed)
Refill request

## 2014-07-25 ENCOUNTER — Other Ambulatory Visit: Payer: Self-pay

## 2014-07-25 DIAGNOSIS — Z1231 Encounter for screening mammogram for malignant neoplasm of breast: Secondary | ICD-10-CM

## 2014-08-24 ENCOUNTER — Ambulatory Visit
Admission: RE | Admit: 2014-08-24 | Discharge: 2014-08-24 | Disposition: A | Discharge status: Home / Self Care | Payer: BC Managed Care – PPO | Source: Ambulatory Visit

## 2014-08-24 DIAGNOSIS — Z1231 Encounter for screening mammogram for malignant neoplasm of breast: Secondary | ICD-10-CM

## 2014-08-30 ENCOUNTER — Other Ambulatory Visit: Payer: Self-pay | Admitting: Internal Medicine

## 2014-08-30 NOTE — Telephone Encounter (Signed)
Call pharmacy and clarify   I do not think I prescribe this for her  Who does?

## 2014-08-30 NOTE — Telephone Encounter (Signed)
Refill request

## 2014-08-30 NOTE — Telephone Encounter (Signed)
Yes it was written back in June 2014 by Dr. Coralyn Mark for 400mg  BID

## 2014-08-31 NOTE — Telephone Encounter (Signed)
Shannon Wright called and she needs a refill on her gabapentin (NEURONTIN) 100 MG capsule, she stated that usually get this thru Dr Earle Gell at [facility] but she does have an appointment until January.

## 2014-09-01 ENCOUNTER — Telehealth: Payer: Self-pay | Admitting: *Deleted

## 2014-09-01 ENCOUNTER — Ambulatory Visit (INDEPENDENT_AMBULATORY_CARE_PROVIDER_SITE_OTHER): Payer: BC Managed Care – PPO

## 2014-09-01 DIAGNOSIS — Z23 Encounter for immunization: Secondary | ICD-10-CM

## 2014-09-01 MED ORDER — GABAPENTIN 100 MG PO CAPS: 100.0000 mg | ORAL_CAPSULE | Freq: Three times a day (TID) | ORAL | Status: DC

## 2014-09-01 NOTE — Telephone Encounter (Signed)
Emoni called and would like to get a referral to see Dr. Ellouise Newer (Sombrillo). He specializes in migraines, vergo and balance. She said that her son sees him for this and she would also like to.

## 2014-09-01 NOTE — Telephone Encounter (Signed)
error 

## 2014-09-01 NOTE — Telephone Encounter (Signed)
Shannon Wright  Call pt and make an appt with me 30 mins in January to discuss her medications and who will be refilling these .    Route back with the date of OV

## 2014-09-05 ENCOUNTER — Other Ambulatory Visit: Payer: Self-pay | Admitting: Internal Medicine

## 2014-09-07 NOTE — Telephone Encounter (Signed)
Refill request

## 2014-09-28 ENCOUNTER — Other Ambulatory Visit: Payer: Self-pay | Admitting: Internal Medicine

## 2014-09-28 MED ORDER — TRIAMTERENE-HCTZ 50-25 MG PO CAPS: 1.0000 | ORAL_CAPSULE | Freq: Every morning | ORAL | Status: DC

## 2014-09-28 NOTE — Telephone Encounter (Signed)
Refill request

## 2014-09-28 NOTE — Telephone Encounter (Signed)
Shannon Wright  I am not sure that I prescribe acyclovir for this pt .  Call pt and ask her who prescribes this for her .

## 2014-10-10 ENCOUNTER — Encounter: Payer: Self-pay | Admitting: Internal Medicine

## 2014-10-10 ENCOUNTER — Ambulatory Visit (INDEPENDENT_AMBULATORY_CARE_PROVIDER_SITE_OTHER): Payer: BLUE CROSS/BLUE SHIELD | Admitting: Internal Medicine

## 2014-10-10 VITALS — BP 132/80 | HR 74 | Resp 16 | Ht 70.0 in | Wt 197.0 lb

## 2014-10-10 DIAGNOSIS — I10 Essential (primary) hypertension: Secondary | ICD-10-CM | POA: Diagnosis not present

## 2014-10-10 DIAGNOSIS — E785 Hyperlipidemia, unspecified: Secondary | ICD-10-CM

## 2014-10-10 DIAGNOSIS — D869 Sarcoidosis, unspecified: Secondary | ICD-10-CM | POA: Diagnosis not present

## 2014-10-10 DIAGNOSIS — N951 Menopausal and female climacteric states: Secondary | ICD-10-CM

## 2014-10-10 LAB — COMPLETE METABOLIC PANEL WITHOUT GFR
ALT: 21 U/L (ref 0–35)
AST: 24 U/L (ref 0–37)
Albumin: 4.6 g/dL (ref 3.5–5.2)
Alkaline Phosphatase: 48 U/L (ref 39–117)
BUN: 13 mg/dL (ref 6–23)
CO2: 29 meq/L (ref 19–32)
Calcium: 9.5 mg/dL (ref 8.4–10.5)
Chloride: 99 meq/L (ref 96–112)
Creat: 0.96 mg/dL (ref 0.50–1.10)
GFR, Est African American: 76 mL/min
GFR, Est Non African American: 66 mL/min
Glucose, Bld: 95 mg/dL (ref 70–99)
Potassium: 4.3 meq/L (ref 3.5–5.3)
Sodium: 137 meq/L (ref 135–145)
Total Bilirubin: 0.5 mg/dL (ref 0.2–1.2)
Total Protein: 6.9 g/dL (ref 6.0–8.3)

## 2014-10-10 LAB — LIPID PANEL
CHOLESTEROL: 284 mg/dL — AB (ref 0–200)
HDL: 64 mg/dL (ref >39)
LDL Cholesterol: 197 mg/dL — ABNORMAL HIGH (ref 0–99)
TRIGLYCERIDES: 114 mg/dL (ref <150)
Total CHOL/HDL Ratio: 4.4 Ratio
VLDL: 23 mg/dL (ref 0–40)

## 2014-10-10 LAB — TSH: TSH: 2.175 u[IU]/mL (ref 0.350–4.500)

## 2014-10-10 MED ORDER — HYDROCORTISONE 2.5 % EX CREA: TOPICAL_CREAM | Freq: Two times a day (BID) | CUTANEOUS | Status: DC

## 2014-10-10 MED ORDER — DESVENLAFAXINE SUCCINATE ER 100 MG PO TB24: 100.0000 mg | ORAL_TABLET | Freq: Every day | ORAL | Status: DC

## 2014-10-10 MED ORDER — ESTRADIOL 0.05 MG/24HR TD PTTW: 1.0000 | MEDICATED_PATCH | TRANSDERMAL | Status: DC

## 2014-10-10 NOTE — Progress Notes (Signed)
Subjective:    Patient ID: Shannon Wright, female    DOB: 19-Oct-1955, 59 y.o.   MRN: [id number]  HPI  01/2014 note MGUS Will have recheck q 14months with [facility]ology  Hyperlipidemia Will recheck today  Sarocid with mild hypercalcemia Recheck today  TODAY:  Shannon Wright is here for follow up  Doing well    Menopausal flushes  Well controlled  Would like ot start decreasing dose of estradiol  Has rash underneath ring finger  Mixed anxiety/depression  Needs Pristiq  On Acyclovir per dermatologist    No Known Allergies Past Medical History  Diagnosis Date  . Sarcoidosis   . Right knee meniscal tear   . ANA positive   . Genital ulcer, female     question Bechets  . Menopause   . Anxiety     chronic  . Arthritis   . Hypertension   . Hyperlipidemia   . Thyroid disease     MNG S/P total lthyroidectomy  . Basal cell carcinoma of skin   . Hypercalcemia    Past Surgical History  Procedure Laterality Date  . Total thyroidectomy  04/2010  . Abdominal hysterectomy  2005  . Bilateral salpingoophorectomy  2005   History   Social History  . Marital Status: Married    Spouse Name: Bill    Number of Children: 0  . Years of Education: N/A   Occupational History  . VP    Social History Main Topics  . Smoking status: Former Smoker -- 0.25 packs/day for 10 years    Quit date: 09/22/2004  . Smokeless tobacco: Never Used  . Alcohol Use: No  . Drug Use: No  . Sexual Activity: Yes    Birth Control/ Protection: Surgical   Other Topics Concern  . Not on file   Social History Narrative   2 Step Daughters   Hobby - scuba diving         Family History  Problem Relation Age of Onset  . Cancer Mother     head and neck  . Other Mother     aortic valve replacement  . Hypertension Mother   . Arthritis Mother   . Osteoporosis Mother   . Other Sister     tachycardia  . Cancer Maternal Grandmother     Colon  . Cancer Maternal Grandfather     Lung   Patient  Active Problem List   Diagnosis Date Noted  .  MGUS  see note from Dr Rennis Petty at [facility] 10/24/2013  . Symptomatic cholelithiasis 07/28/2013  . Biliary colic 41/[id number]/7902  . 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 S/P hysterectomy 03/30/2012  . 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 Hypercalcemia 07/31/2011  . Gallstones 07/28/2011  . Anxiety   . Arthritis   . Hyperlipidemia   . Basal cell carcinoma of skin   . ANA positive   . Menopause   . PARESTHESIA 12/05/2010  . HOT FLASHES 12/05/2010  . HYPOTHYROIDISM 06/07/2010  . 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 GOITER, MULTINODULAR 01/03/2010  . THYROID NODULE, LEFT 12/26/2009  . PERIORBITAL CELLULITIS 12/24/2009  . DYSFUNCTION OF EUSTACHIAN TUBE 12/14/2009  . HAND PAIN, RIGHT 12/14/2009  . PALPITATIONS, OCCASIONAL 08/14/2009  . HYPERGLYCEMIA 05/16/2009  . GLUCOCORTICOID DEFICIENCY 04/19/2009  . Sarcoidosis 03/22/2009  . HYPERTENSION 03/22/2009   Current Outpatient Prescriptions on File Prior to Visit  Medication Sig Dispense Refill  . acyclovir (ZOVIRAX) 400 MG tablet TAKE 1 TABLET BY MOUTH TWICE DAILY 60 tablet 0  . ALPRAZolam (XANAX) 0.5  MG tablet     . estradiol (VIVELLE-DOT) 0.075 MG/24HR APPLY ONE PATCH ONTO THE SKIN TWICE WEEKLY AS DIRECTED 8 patch 5  . gabapentin (NEURONTIN) 100 MG capsule Take 1 capsule (100 mg total) by mouth 3 (three) times daily. 90 capsule 0  . levothyroxine (SYNTHROID, LEVOTHROID) 125 MCG tablet TAKE 1 TABLET BY MOUTH EVERY DAY 90 tablet 2  . PRISTIQ 50 MG 24 hr tablet     . triamterene-hydrochlorothiazide (DYAZIDE) 50-25 MG per capsule Take 1 capsule by mouth every morning. 90 capsule 1   No current facility-administered medications on file prior to visit.      Review of Systems See HPI    Objective:   Physical Exam Physical Exam  Nursing note and vitals reviewed.  Constitutional: She is oriented to person, place, and time. She appears well-developed and well-nourished.  HENT:  Head: Normocephalic and  atraumatic.  Cardiovascular: Normal rate and regular rhythm. Exam reveals no gallop and no friction rub.  No murmur heard.  Pulmonary/Chest: Breath sounds normal. She has no wheezes. She has no rales.  Neurological: She is alert and oriented to person, place, and time.  Skin: Skin is warm and dry.  She has red rash underneath ring finger Psychiatric: She has a normal mood and affect. Her behavior is normal.        Assessment & Plan:  HTN  Repeat BP Ok continue diuretic  Check chemistries today  Menopausal hot flushes  Ok to decrease dose to .05 estradiol patch  Dermatitis  Ok for HC 2.5 % bid to ring finger   Sarcoid  Managed by pulmonary   Mixed anxiety/depression:   Will re-order Pristiq  Hypothryoidism  Will check labs today  Continue meds

## 2014-10-17 ENCOUNTER — Telehealth: Payer: Self-pay | Admitting: *Deleted

## 2014-10-17 NOTE — Telephone Encounter (Signed)
-----   Message from Lanice Shirts, MD sent at 10/15/2014  9:07 AM EST ----- Call pt and let her know that her thyroid blood test is fine but  her bad cholesterol is much higher. Give her a 30 min OV with me to discuss  Ok to mail labs to her  Route back with date thanks.

## 2014-10-17 NOTE — Telephone Encounter (Signed)
I spoke with Shannon Wright and set her up for follow up -eh

## 2014-10-25 ENCOUNTER — Other Ambulatory Visit: Payer: Self-pay | Admitting: Internal Medicine

## 2014-10-25 NOTE — Telephone Encounter (Signed)
Refill request

## 2014-10-30 ENCOUNTER — Encounter: Payer: Self-pay | Admitting: Internal Medicine

## 2014-10-30 ENCOUNTER — Ambulatory Visit (INDEPENDENT_AMBULATORY_CARE_PROVIDER_SITE_OTHER): Payer: BLUE CROSS/BLUE SHIELD | Admitting: Internal Medicine

## 2014-10-30 VITALS — BP 132/78 | HR 82 | Resp 16 | Ht 70.0 in | Wt 194.0 lb

## 2014-10-30 DIAGNOSIS — E785 Hyperlipidemia, unspecified: Secondary | ICD-10-CM | POA: Diagnosis not present

## 2014-10-30 DIAGNOSIS — F418 Other specified anxiety disorders: Secondary | ICD-10-CM | POA: Diagnosis not present

## 2014-10-30 DIAGNOSIS — I10 Essential (primary) hypertension: Secondary | ICD-10-CM | POA: Diagnosis not present

## 2014-10-30 NOTE — Progress Notes (Signed)
Subjective:    Patient ID: Shannon Wright, female    DOB: 1956/05/23, 59 y.o.   MRN: [id number]  HPI  10/10/2014 note HTN Repeat BP Ok continue diuretic Check chemistries today  Menopausal hot flushes Ok to decrease dose to .05 estradiol patch  Dermatitis Ok for HC 2.5 % bid to ring finger   Sarcoid Managed by pulmonary   Mixed anxiety/depression: Will re-order Pristiq  Hypothryoidism Will check labs today Continue meds   TODAY  [name] is here for follow up of abnormal lipids.  Fh pos for MI in father in his 65's   Pt has well controlled HTN  She is a non-smoker   Framingham risk score  8.5%  Pt repeatedly states she will not take a statin due to SE profile  She reports lots of dietary indiscretion    No Known Allergies Past Medical History  Diagnosis Date  . Sarcoidosis   . Right knee meniscal tear   . ANA positive   . Genital ulcer, female     question Bechets  . Menopause   . Anxiety     chronic  . Arthritis   . Hypertension   . Hyperlipidemia   . Thyroid disease     MNG S/P total lthyroidectomy  . Basal cell carcinoma of skin   . Hypercalcemia    Past Surgical History  Procedure Laterality Date  . Total thyroidectomy  04/2010  . Abdominal hysterectomy  2005  . Bilateral salpingoophorectomy  2005   History   Social History  . Marital Status: Married    Spouse Name: Bill    Number of Children: 0  . Years of Education: N/A   Occupational History  . VP    Social History Main Topics  . Smoking status: Former Smoker -- 0.25 packs/day for 10 years    Quit date: 09/22/2004  . Smokeless tobacco: Never Used  . Alcohol Use: No  . Drug Use: No  . Sexual Activity: Yes    Birth Control/ Protection: Surgical   Other Topics Concern  . Not on file   Social History Narrative   2 Step Daughters   Hobby - scuba diving         Family History  Problem Relation Age of Onset  . Cancer Mother     head and neck  . Other Mother     aortic  valve replacement  . Hypertension Mother   . Arthritis Mother   . Osteoporosis Mother   . Other Sister     tachycardia  . Cancer Maternal Grandmother     Colon  . Cancer Maternal Grandfather     Lung   Patient Active Problem List   Diagnosis Date Noted  .  MGUS  see note from Dr Rennis Petty at [facility] 10/24/2013  . Symptomatic cholelithiasis 07/28/2013  . Biliary colic 42/39/5320  . 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 S/P hysterectomy 03/30/2012  . 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 Hypercalcemia 07/31/2011  . Gallstones 07/28/2011  . Anxiety   . Arthritis   . Hyperlipidemia   . Basal cell carcinoma of skin   . ANA positive   . Menopause   . PARESTHESIA 12/05/2010  . HOT FLASHES 12/05/2010  . HYPOTHYROIDISM 06/07/2010  . 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 GOITER, MULTINODULAR 01/03/2010  . THYROID NODULE, LEFT 12/26/2009  . PERIORBITAL CELLULITIS 12/24/2009  . DYSFUNCTION OF EUSTACHIAN TUBE 12/14/2009  . HAND PAIN, RIGHT 12/14/2009  . PALPITATIONS, OCCASIONAL 08/14/2009  . HYPERGLYCEMIA 05/16/2009  .  GLUCOCORTICOID DEFICIENCY 04/19/2009  . Sarcoidosis 03/22/2009  . HYPERTENSION 03/22/2009   Current Outpatient Prescriptions on File Prior to Visit  Medication Sig Dispense Refill  . acyclovir (ZOVIRAX) 400 MG tablet TAKE 1 TABLET BY MOUTH TWICE DAILY 60 tablet 0  . ALPRAZolam (XANAX) 0.5 MG tablet     . desvenlafaxine (PRISTIQ) 100 MG 24 hr tablet Take 1 tablet (100 mg total) by mouth daily. 90 tablet 1  . estradiol (VIVELLE-DOT) 0.05 MG/24HR patch Place 1 patch (0.05 mg total) onto the skin 2 (two) times a week. 8 patch 3  . gabapentin (NEURONTIN) 100 MG capsule TAKE 1 CAPSULE BY MOUTH THREE TIMES DAILY 90 capsule 1  . hydrocortisone 2.5 % cream Apply topically 2 (two) times daily. 30 g 0  . levothyroxine (SYNTHROID, LEVOTHROID) 125 MCG tablet TAKE 1 TABLET BY MOUTH EVERY DAY 90 tablet 2  . triamterene-hydrochlorothiazide (DYAZIDE) 50-25 MG per capsule Take 1 capsule by mouth every  morning. 90 capsule 1   No current facility-administered medications on file prior to visit.      Review of Systems See HPI    Objective:   Physical Exam  Physical Exam  Nursing note and vitals reviewed.  Constitutional: She is oriented to person, place, and time. She appears well-developed and well-nourished.  HENT:  Head: Normocephalic and atraumatic.  Cardiovascular: Normal rate and regular rhythm. Exam reveals no gallop and no friction rub.  No murmur heard.  Pulmonary/Chest: Breath sounds normal. She has no wheezes. She has no rales.  Neurological: She is alert and oriented to person, place, and time.  Skin: Skin is warm and dry.  Psychiatric: She has a normal mood and affect. Her behavior is normal.        Assessment & Plan:  Hyperlipidemia Given DASH diet   Will recheck fasting level in 6 months   HTN continue meds

## 2014-11-28 DIAGNOSIS — D869 Sarcoidosis, unspecified: Secondary | ICD-10-CM | POA: Insufficient documentation

## 2015-01-14 NOTE — Progress Notes (Signed)
Subjective:    Patient ID: Shannon Wright, female    DOB: 1956-03-03, 59 y.o.   MRN: [id number]  HPI 10/2014 note 10/10/2014 note HTN Repeat BP Ok continue diuretic Check chemistries today  Menopausal hot flushes Ok to decrease dose to .05 estradiol patch  Dermatitis Ok for HC 2.5 % bid to ring finger   Sarcoid Managed by pulmonary   Mixed anxiety/depression: Will re-order Pristiq  Hypothryoidism Will check labs today Continue meds   Shannon Wright is here for follow up of abnormal lipids. Fh pos for MI in father in his 84's Pt has well controlled HTN She is a non-smoker   Framingham risk score 8.5% Pt repeatedly states she will not take a statin due to SE profile She reports lots of dietary indiscretion  TODAY  Shannon Wright is here for acute visit .  Reports she is concerened about a head injury when she fell .  First fell off a horse a few months ago and landed on left side of face.  Had dizziness and headaches for a feew weeks but then subsided  5 days ago was standing on a bucket to get a bird feeder and fell off bucket landing on left side of face again.. She describes a numb and fuzziness left side of face and deep into ear area.  No visual or speech changes.  She did see Dr. Laurance Flatten in [facility] and was given a steroid injection but it did not help her.   Headaches off and on   No Known Allergies Past Medical History  Diagnosis Date  . Sarcoidosis   . Right knee meniscal tear   . ANA positive   . Genital ulcer, female     question Bechets  . Menopause   . Anxiety     chronic  . Arthritis   . Hypertension   . Hyperlipidemia   . Thyroid disease     MNG S/P total lthyroidectomy  . Basal cell carcinoma of skin   . Hypercalcemia    Past Surgical History  Procedure Laterality Date  . Total thyroidectomy  04/2010  . Abdominal hysterectomy  2005  . Bilateral salpingoophorectomy  2005   History   Social History  . Marital Status: Married    Spouse Name: Rush Landmark  .  Number of Children: 0  . Years of Education: N/A   Occupational History  . VP    Social History Main Topics  . Smoking status: Former Smoker -- 0.25 packs/day for 10 years    Quit date: 09/22/2004  . Smokeless tobacco: Never Used  . Alcohol Use: No  . Drug Use: No  . Sexual Activity: Yes    Birth Control/ Protection: Surgical   Other Topics Concern  . Not on file   Social History Narrative   2 Step Daughters   Hobby - scuba diving         Family History  Problem Relation Age of Onset  . Cancer Mother     head and neck  . Other Mother     aortic valve replacement  . Hypertension Mother   . Arthritis Mother   . Osteoporosis Mother   . Other Sister     tachycardia  . Cancer Maternal Grandmother     Colon  . Cancer Maternal Grandfather     Lung   Patient Active Problem List   Diagnosis Date Noted  .  MGUS  see note from Dr Rennis Petty at [facility] 10/24/2013  . Symptomatic cholelithiasis 07/28/2013  .  Biliary colic 35/32/9924  . 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 S/P hysterectomy 03/30/2012  . 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 Hypercalcemia 07/31/2011  . Gallstones 07/28/2011  . Anxiety   . Arthritis   . Hyperlipidemia   . Basal cell carcinoma of skin   . ANA positive   . Menopause   . PARESTHESIA 12/05/2010  . HOT FLASHES 12/05/2010  . HYPOTHYROIDISM 06/07/2010  . 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 GOITER, MULTINODULAR 01/03/2010  . THYROID NODULE, LEFT 12/26/2009  . PERIORBITAL CELLULITIS 12/24/2009  . DYSFUNCTION OF EUSTACHIAN TUBE 12/14/2009  . HAND PAIN, RIGHT 12/14/2009  . PALPITATIONS, OCCASIONAL 08/14/2009  . HYPERGLYCEMIA 05/16/2009  . GLUCOCORTICOID DEFICIENCY 04/19/2009  . Sarcoidosis 03/22/2009  . HYPERTENSION 03/22/2009   Current Outpatient Prescriptions on File Prior to Visit  Medication Sig Dispense Refill  . acyclovir (ZOVIRAX) 400 MG tablet TAKE 1 TABLET BY MOUTH TWICE DAILY 60 tablet 0  . ALPRAZolam (XANAX) 0.5 MG tablet     . desvenlafaxine  (PRISTIQ) 100 MG 24 hr tablet Take 1 tablet (100 mg total) by mouth daily. 90 tablet 1  . estradiol (VIVELLE-DOT) 0.05 MG/24HR patch Place 1 patch (0.05 mg total) onto the skin 2 (two) times a week. 8 patch 3  . gabapentin (NEURONTIN) 100 MG capsule TAKE 1 CAPSULE BY MOUTH THREE TIMES DAILY 90 capsule 1  . hydrocortisone 2.5 % cream Apply topically 2 (two) times daily. 30 g 0  . levothyroxine (SYNTHROID, LEVOTHROID) 125 MCG tablet TAKE 1 TABLET BY MOUTH EVERY DAY 90 tablet 2  . triamterene-hydrochlorothiazide (DYAZIDE) 50-25 MG per capsule Take 1 capsule by mouth every morning. 90 capsule 1   No current facility-administered medications on file prior to visit.       Review of Systems See HPI    Objective:   Physical Exam  Physical Exam  Nursing note and vitals reviewed.  Constitutional: She is oriented to person, place, and time. She appears well-developed and well-nourished.  HENT:  Head: Normocephalic and atraumatic.  TM's  Clear no blood in TM  Face  Tender TMJ area Cardiovascular: Normal rate and regular rhythm. Exam reveals no gallop and no friction rub.  No murmur heard.  Pulmonary/Chest: Breath sounds normal. She has no wheezes. She has no rales.  Neurological: She is alert and oriented to person, place, and time. CN II-XII intact  Motor 5/5 UE and LE  Sensory intact to pp Cerebellar  Intact FTN  Skin: Skin is warm and dry.  Psychiatric: She has a normal mood and affect. Her behavior is normal.        Assessment & Plan:

## 2015-01-15 ENCOUNTER — Ambulatory Visit (HOSPITAL_BASED_OUTPATIENT_CLINIC_OR_DEPARTMENT_OTHER)
Admission: RE | Admit: 2015-01-15 | Discharge: 2015-01-15 | Disposition: A | Discharge status: Home / Self Care | Payer: BLUE CROSS/BLUE SHIELD | Source: Ambulatory Visit | Attending: Internal Medicine | Admitting: Internal Medicine

## 2015-01-15 ENCOUNTER — Telehealth: Payer: Self-pay | Admitting: *Deleted

## 2015-01-15 ENCOUNTER — Encounter: Payer: Self-pay | Admitting: Internal Medicine

## 2015-01-15 ENCOUNTER — Ambulatory Visit (INDEPENDENT_AMBULATORY_CARE_PROVIDER_SITE_OTHER): Payer: BLUE CROSS/BLUE SHIELD | Admitting: Internal Medicine

## 2015-01-15 VITALS — BP 138/85 | HR 82 | Resp 16 | Ht 70.0 in | Wt 197.0 lb

## 2015-01-15 DIAGNOSIS — R519 Headache, unspecified: Secondary | ICD-10-CM

## 2015-01-15 DIAGNOSIS — S0990XA Unspecified injury of head, initial encounter: Secondary | ICD-10-CM | POA: Insufficient documentation

## 2015-01-15 DIAGNOSIS — W108XXA Fall (on) (from) other stairs and steps, initial encounter: Secondary | ICD-10-CM

## 2015-01-15 DIAGNOSIS — Y9352 Activity, horseback riding: Secondary | ICD-10-CM | POA: Diagnosis not present

## 2015-01-15 DIAGNOSIS — R51 Headache: Secondary | ICD-10-CM

## 2015-01-15 DIAGNOSIS — M2662 Arthralgia of temporomandibular joint: Secondary | ICD-10-CM | POA: Diagnosis not present

## 2015-01-15 MED ORDER — DESVENLAFAXINE SUCCINATE ER 100 MG PO TB24: 100.0000 mg | ORAL_TABLET | Freq: Every day | ORAL | Status: DC

## 2015-01-15 MED ORDER — TRIAMTERENE-HCTZ 50-25 MG PO CAPS: 1.0000 | ORAL_CAPSULE | Freq: Every morning | ORAL | Status: DC

## 2015-01-15 MED ORDER — LEVOTHYROXINE SODIUM 125 MCG PO TABS: 125.0000 ug | ORAL_TABLET | Freq: Every day | ORAL | Status: AC

## 2015-01-15 NOTE — Telephone Encounter (Signed)
Shannon Wright is aware of her CT results-eh

## 2015-01-15 NOTE — Telephone Encounter (Signed)
-----   Message from Lanice Shirts, MD sent at 01/15/2015  2:34 PM EDT ----- Shannon Wright   Call pt and let her know that is no evidence of bleeding or mass and no evidence of any facial bone fracture or injury.  Advise pt to follow up with Dr. Laurance Flatten her ENT.  She can get copy of CT from radiology downstairs and take it to him

## 2015-04-24 ENCOUNTER — Other Ambulatory Visit: Payer: BLUE CROSS/BLUE SHIELD

## 2015-05-01 ENCOUNTER — Encounter: Payer: BLUE CROSS/BLUE SHIELD | Admitting: Internal Medicine

## 2015-08-07 ENCOUNTER — Other Ambulatory Visit: Payer: Self-pay

## 2015-08-07 DIAGNOSIS — Z1231 Encounter for screening mammogram for malignant neoplasm of breast: Secondary | ICD-10-CM

## 2015-09-06 ENCOUNTER — Ambulatory Visit (INDEPENDENT_AMBULATORY_CARE_PROVIDER_SITE_OTHER): Payer: BLUE CROSS/BLUE SHIELD | Admitting: Internal Medicine

## 2015-09-06 ENCOUNTER — Encounter: Payer: Self-pay | Admitting: Internal Medicine

## 2015-09-06 VITALS — BP 152/90 | HR 76 | Ht 70.0 in | Wt 200.4 lb

## 2015-09-06 DIAGNOSIS — I10 Essential (primary) hypertension: Secondary | ICD-10-CM | POA: Diagnosis not present

## 2015-09-06 NOTE — Progress Notes (Signed)
Cardiology Office Note   Date:  09/06/2015   ID:  Shannon Wright, DOB 10/02/1955, MRN [id number]  PCP:  Kelton Pillar, MD  Cardiologist:   Dorris Carnes, MD   Presents for evaluation of hyperlipidemia and chest pressure    History of Present Illness: Shannon Wright is a 59 y.o. female with a history of hyperlipdemia  Most recent lipids in November LDL was 178  Has been better in the past, 125 in 2014       Pt has a history of sarcoid  Due to establish in pulmonary clinic at [facility]. She does get chest pressure  Occurs while moving around house She does admit to bending/standing often  None at night   2 years ago had discomfort while in bed  3 AM    Gest schest pressure  When moving around house will get cehst tightness NOne at night .  Taken off of Prisitiq  2 year ago had pain in Bed at 3 AM ( 2014; [facility])   Current Outpatient Prescriptions  Medication Sig Dispense Refill  . acyclovir (ZOVIRAX) 400 MG tablet TAKE 1 TABLET BY MOUTH TWICE DAILY 60 tablet 0  . estradiol (VIVELLE-DOT) 0.075 MG/24HR Place 1 patch onto the skin 2 (two) times a week.  9  . gabapentin (NEURONTIN) 300 MG capsule Take 300 mg by mouth daily.  4  . levothyroxine (SYNTHROID, LEVOTHROID) 125 MCG tablet Take 1 tablet (125 mcg total) by mouth daily. 90 tablet 2  . pantoprazole (PROTONIX) 20 MG tablet Take 20 mg by mouth daily.  2  . triamterene-hydrochlorothiazide (DYAZIDE) 50-25 MG per capsule Take 1 capsule by mouth every morning. 90 capsule 1   No current facility-administered medications for this visit.    Allergies:   Review of patient's allergies indicates no known allergies.   Past Medical History  Diagnosis Date  . Sarcoidosis ([place])   . Right knee meniscal tear   . ANA positive   . Genital ulcer, female     question Bechets  . Menopause   . Anxiety     chronic  . Arthritis   . Hypertension   . Hyperlipidemia   . Thyroid disease     MNG S/P total  lthyroidectomy  . Basal cell carcinoma of skin   . Hypercalcemia   . MGUS (monoclonal gammopathy of unknown significance)   . GERD (gastroesophageal reflux disease)   . HSV-1 infection   . Basal cell carcinoma   . Cholelithiasis   . Depression     Past Surgical History  Procedure Laterality Date  . Total thyroidectomy  04/2010  . Abdominal hysterectomy  2005  . Bilateral salpingoophorectomy  2005     Social History:  The patient  reports that she quit smoking about 10 years ago. She has never used smokeless tobacco. She reports that she does not drink alcohol or use illicit drugs.   Family History:  The patient's family history includes Arthritis in her mother; Cancer in her maternal grandfather, maternal grandmother, and mother; Hypertension in her mother; Osteoporosis in her mother; Other in her mother and sister.  Father had MI in 14s    ROS:  Please see the history of present illness. All other systems are reviewed and  Negative to the above problem except as noted.    PHYSICAL EXAM: VS:  BP 152/90 mmHg  Pulse 76  Ht 5\' 10"  (1.778 m)  Wt 90.901 kg (200 lb 6.4 oz)  BMI 28.75 kg/m2  BP on may check 158/102   GEN: Well nourished, well developed, in no acute distress HEENT: normal Neck: no JVD, carotid bruits, or masses Cardiac: RRR; no murmurs, rubs, or gallops,no edema  Respiratory:  clear to auscultation bilaterally, normal work of breathing GI: soft, nontender, nondistended, + BS  No hepatomegaly  MS: no deformity Moving all extremities   Skin: warm and dry, no rash Neuro:  Strength and sensation are intact Psych: euthymic mood, full affect   EKG:  EKG is ordered today.  SR 76 bpm   Lipid Panel    Component Value Date/Time   CHOL 284* 10/10/2014 1055   TRIG 114 10/10/2014 1055   HDL 64 10/10/2014 1055   CHOLHDL 4.4 10/10/2014 1055   VLDL 23 10/10/2014 1055   LDLCALC 197* 10/10/2014 1055      Wt Readings from Last 3 Encounters:  09/06/15 90.901 kg  (200 lb 6.4 oz)  01/15/15 89.359 kg (197 lb)  10/30/14 87.998 kg (194 lb)      ASSESSMENT AND PLAN:  1  Chest pressure  I am not convinced cardiac, esp with history of sarcoid. Does not associate directly with all activites  But has signif dyslipidemia WIll check Ca score CT to define risk further.   Did have evaluation (ischemic) at [facility] several years ago  Will have him sime a release    2.  Hyperlipidemia  LDL is too high Goals for lowering will be based on Ca score.  3.  BP  High today  Will have checked when she returns for CT  No changes for now  4.  Sarcoid  Pt has appt soon in pumonary   F/U based on test results     Signed, Dorris Carnes, MD  09/06/2015 12:01 PM    [facility] [place], [place], [place]  [address] Phone: [phone]; Fax: [phone]

## 2015-09-06 NOTE — Patient Instructions (Signed)
Your physician recommends that you continue on your current medications as directed. Please refer to the Current Medication list given to you today.  Please schedule a calcium score CT scan.  This test can be performed in our office.  Insurance does not cover.  The cost is $150. Dr. Harrington Challenger would like your blood pressure checked the same day you come for the scan.

## 2015-09-07 ENCOUNTER — Ambulatory Visit
Admission: RE | Admit: 2015-09-07 | Discharge: 2015-09-07 | Disposition: A | Discharge status: Home / Self Care | Payer: BLUE CROSS/BLUE SHIELD | Source: Ambulatory Visit

## 2015-09-07 DIAGNOSIS — Z1231 Encounter for screening mammogram for malignant neoplasm of breast: Secondary | ICD-10-CM

## 2015-09-20 NOTE — Addendum Note (Signed)
Addended by: Barbarann Ehlers A on: 09/20/2015 09:24 AM   Modules accepted: Orders

## 2015-09-25 ENCOUNTER — Ambulatory Visit (INDEPENDENT_AMBULATORY_CARE_PROVIDER_SITE_OTHER): Payer: BLUE CROSS/BLUE SHIELD | Admitting: Internal Medicine

## 2015-09-25 ENCOUNTER — Ambulatory Visit (INDEPENDENT_AMBULATORY_CARE_PROVIDER_SITE_OTHER)
Admission: RE | Admit: 2015-09-25 | Discharge: 2015-09-25 | Disposition: A | Discharge status: Home / Self Care | Payer: BLUE CROSS/BLUE SHIELD | Source: Ambulatory Visit | Attending: Internal Medicine | Admitting: Internal Medicine

## 2015-09-25 VITALS — BP 151/100 | HR 78 | Ht 70.0 in | Wt 200.0 lb

## 2015-09-25 DIAGNOSIS — I10 Essential (primary) hypertension: Secondary | ICD-10-CM

## 2015-09-25 MED ORDER — AMLODIPINE BESYLATE 2.5 MG PO TABS: 2.5000 mg | ORAL_TABLET | Freq: Every day | ORAL | Status: DC

## 2015-09-25 NOTE — Progress Notes (Signed)
Patient comes in to office today for calcium CT and BP check, per Dr. Harrington Challenger. BP today is 151/100 (left arm) and 148/97 (right arm).  Reviewed with Dr. Caryl Comes, DOD: Order received to start Amlodipine 2.5 mg daily and follow up with PCP.  Will route to Dr. Harrington Challenger for her FYI and to advise patient on follow up (there is no follow up in [facility]). Will fax to PCP also, along with last OV w/ Dr. Harrington Challenger.

## 2015-09-26 ENCOUNTER — Telehealth: Payer: Self-pay | Admitting: *Deleted

## 2015-09-26 NOTE — Telephone Encounter (Signed)
Message     Left msg on VM re test CT scan CA score 0    Pt was put on amlodipine by Olin Pia for BP elevation    She should have f/u with me in about 4 wks       Called patient to schedule appointment with Dr. Harrington Challenger. OK to schedule Feb 9, one of the end spots in the AM.  Left patient message to call back.

## 2015-10-31 NOTE — Progress Notes (Signed)
Cardiology Office Note   Date:  11/01/2015   ID:  Shannon Wright, DOB 1955-11-06, MRN [id number]  PCP:  Kelton Pillar, MD  Cardiologist:   Dorris Carnes, MD    F/U of HTN and HL   And chest pressure    History of Present Illness: Shannon Wright is a 60 y.o. female with a history of sarcoid, HL and chest pressure  I saw her in December 2016.       When I saw her I ordered a cardiac calcium score. This was 0 She was seen by Olin Pia on day of CT scan as  BP was elevated at 151/100   Olin Pia prescribed 2.5 mg amlodipine per day  Since seen she has done well  BP 140s / 88/90  She had episodes of N/V on Monday night  Fever to 102  SHe did not have any more after this but says she does not feel right  Drinking fluids but still feels dry  Appetite not normal    Chest pressure occurs intermitt. Under stress  Question near food intake  Can walk outside up/down hills without a problem     Current Outpatient Prescriptions  Medication Sig Dispense Refill  . acyclovir (ZOVIRAX) 400 MG tablet TAKE 1 TABLET BY MOUTH TWICE DAILY 60 tablet 0  . estradiol (VIVELLE-DOT) 0.075 MG/24HR Place 1 patch onto the skin 2 (two) times a week.  9  . gabapentin (NEURONTIN) 300 MG capsule Take 300 mg by mouth daily.  4  . levothyroxine (SYNTHROID, LEVOTHROID) 125 MCG tablet Take 1 tablet (125 mcg total) by mouth daily. 90 tablet 2  . pantoprazole (PROTONIX) 20 MG tablet Take 20 mg by mouth daily.  2  . triamterene-hydrochlorothiazide (DYAZIDE) 50-25 MG per capsule Take 1 capsule by mouth every morning. 90 capsule 1  . amLODipine (NORVASC) 2.5 MG tablet Take 1 tablet (2.5 mg total) by mouth daily. (Patient not taking: Reported on 11/01/2015) 30 tablet 3   No current facility-administered medications for this visit.    Allergies:   Review of patient's allergies indicates no known allergies.   Past Medical History  Diagnosis Date  . Sarcoidosis ([place])   . Right knee meniscal tear   . ANA positive     . Genital ulcer, female     question Bechets  . Menopause   . Anxiety     chronic  . Arthritis   . Hypertension   . Hyperlipidemia   . Thyroid disease     MNG S/P total lthyroidectomy  . Basal cell carcinoma of skin   . Hypercalcemia   . MGUS (monoclonal gammopathy of unknown significance)   . GERD (gastroesophageal reflux disease)   . HSV-1 infection   . Basal cell carcinoma   . Cholelithiasis   . Depression     Past Surgical History  Procedure Laterality Date  . Total thyroidectomy  04/2010  . Abdominal hysterectomy  2005  . Bilateral salpingoophorectomy  2005     Social History:  The patient  reports that she quit smoking about 11 years ago. She has never used smokeless tobacco. She reports that she does not drink alcohol or use illicit drugs.   Family History:  The patient's family history includes Arthritis in her mother; Cancer in her maternal grandfather, maternal grandmother, and mother; Hypertension in her mother; Osteoporosis in her mother; Other in her mother and sister.    ROS:  Please see the history of present illness. All other systems are  reviewed and  Negative to the above problem except as noted.    PHYSICAL EXAM: VS:  BP 128/68 mmHg  Pulse 90  Ht 5\' 10"  (1.778 m)  Wt 195 lb 12.8 oz (88.814 kg)  BMI 28.09 kg/m2  SpO2 98%  GEN: Well nourished, well developed, in no acute distress HEENT: normal Neck: no JVD, carotid bruits, or masses Cardiac: RRR; no murmurs, rubs, or gallops,no edema  Respiratory:  clear to auscultation bilaterally, normal work of breathing GI: soft, nontender, nondistended, + BS  No hepatomegaly  MS: no deformity Moving all extremities   Skin: warm and dry, no rash Neuro:  Strength and sensation are intact Psych: euthymic mood, full affect   EKG:  EKG is not ordered today.   Lipid Panel    Component Value Date/Time   CHOL 284* 10/10/2014 1055   TRIG 114 10/10/2014 1055   HDL 64 10/10/2014 1055   CHOLHDL 4.4 10/10/2014  1055   VLDL 23 10/10/2014 1055   LDLCALC 197* 10/10/2014 1055      Wt Readings from Last 3 Encounters:  11/01/15 195 lb 12.8 oz (88.814 kg)  09/25/15 200 lb (90.719 kg)  09/06/15 200 lb 6.4 oz (90.901 kg)      ASSESSMENT AND PLAN:  1  HTN BP is OK today  BUt she says at home has been running still a little high  When she does get over GI bug that she had,   2  HL  Pt would like to work on diet  She has had a huge fluctuation in LDL over several years  Will call back and have checked later   3  GI  Is overdue for colonoscopy  And, needs to have possible upper GI issues evaluated  (see HPI)  Pt to call in with BP readings  F/U based on these  Has f/u in IM soon    Current medicines are reviewed at length with the patient today.  The patient does not have concerns regarding medicines.  Signed, Dorris Carnes, MD  11/01/2015 10:31 AM    [address] [place], [place], [place]  [address] Phone: [phone]; Fax: [phone]

## 2015-11-01 ENCOUNTER — Ambulatory Visit (INDEPENDENT_AMBULATORY_CARE_PROVIDER_SITE_OTHER): Payer: BLUE CROSS/BLUE SHIELD | Admitting: Internal Medicine

## 2015-11-01 ENCOUNTER — Ambulatory Visit: Payer: BLUE CROSS/BLUE SHIELD | Admitting: Pulmonary Disease

## 2015-11-01 ENCOUNTER — Encounter: Payer: Self-pay | Admitting: Gastroenterology

## 2015-11-01 ENCOUNTER — Encounter: Payer: Self-pay | Admitting: Internal Medicine

## 2015-11-01 VITALS — BP 128/68 | HR 90 | Ht 70.0 in | Wt 195.8 lb

## 2015-11-01 DIAGNOSIS — Z1211 Encounter for screening for malignant neoplasm of colon: Secondary | ICD-10-CM

## 2015-11-01 DIAGNOSIS — I10 Essential (primary) hypertension: Secondary | ICD-10-CM

## 2015-11-01 MED ORDER — AMLODIPINE BESYLATE 5 MG PO TABS: 5.0000 mg | ORAL_TABLET | Freq: Every day | ORAL | Status: DC

## 2015-11-01 NOTE — Patient Instructions (Signed)
Your physician has recommended you make the following change in your medication:  1.) increase amlodipine to 5 mg daily  Your physician recommends that you return for lab work in: about a 4-6 weeks  (lipids)  Please monitor your blood pressure on the higher dose of amlodipine.

## 2015-12-26 ENCOUNTER — Ambulatory Visit (INDEPENDENT_AMBULATORY_CARE_PROVIDER_SITE_OTHER): Payer: Managed Care, Other (non HMO) | Admitting: Gastroenterology

## 2015-12-26 ENCOUNTER — Encounter: Payer: Self-pay | Admitting: Gastroenterology

## 2015-12-26 VITALS — BP 110/76 | HR 68 | Ht 70.0 in | Wt 199.5 lb

## 2015-12-26 DIAGNOSIS — R221 Localized swelling, mass and lump, neck: Secondary | ICD-10-CM

## 2015-12-26 DIAGNOSIS — Z1211 Encounter for screening for malignant neoplasm of colon: Secondary | ICD-10-CM

## 2015-12-26 DIAGNOSIS — K219 Gastro-esophageal reflux disease without esophagitis: Secondary | ICD-10-CM | POA: Insufficient documentation

## 2015-12-26 DIAGNOSIS — K5900 Constipation, unspecified: Secondary | ICD-10-CM

## 2015-12-26 DIAGNOSIS — R079 Chest pain, unspecified: Secondary | ICD-10-CM | POA: Diagnosis not present

## 2015-12-26 DIAGNOSIS — K8020 Calculus of gallbladder without cholecystitis without obstruction: Secondary | ICD-10-CM

## 2015-12-26 MED ORDER — NA SULFATE-K SULFATE-MG SULF 17.5-3.13-1.6 GM/177ML PO SOLN: 1.0000 | Freq: Once | ORAL | Status: DC

## 2015-12-26 NOTE — Progress Notes (Signed)
History of Present Illness: This is a 60 year old female referred by Fay Records, MD for the evaluation of chronic constipation, intermittent chest pain, GERD, lump in throat. She relates a long history of GERD with typical heartburn symptoms. She was previously evaluated by Dr. Jerl Santos at [facility] and she states she underwent endoscopy in 2011 at [facility] which was normal. She has a MGM with a history of colon cancer and underwent colonoscopy in [place] in 2004 which she states was normal. Unfortunately, I do not have those records available today. Over the past several months the patient relates problems with intermittent brief episodes of chest pressure. These episodes occur randomly and last a few minutes at a time. They're not correlated with meals or exertion. She's been evaluated by Dr. Dorris Carnes who feels her symptoms are not cardiac related. In addition she complains of a frequent lump in her throat sensation for many months. She's currently treated with pantoprazole 20 mg daily and does not feel that this medication improves her symptoms. She has had constipation problems for many years recently. She is currently treated with MiraLAX which has been effective. MiraLAX was previously recommended by Dr. Jerl Santos. She has a history of symptomatic cholelithiasis and was recommended undergo cholecystectomy by Dr. Zella Richer in 2014 however she deferred. Denies weight loss, abdominal pain, diarrhea, change in stool caliber, melena, hematochezia, nausea, vomiting, dysphagia.  Review of Systems: Pertinent positive and negative review of systems were noted in the above HPI section. All other review of systems were otherwise negative.  Current Medications, Allergies, Past Medical History, Past Surgical History, Family History and Social History were reviewed in [employer] record.  Physical Exam: General: Well developed, well nourished, no acute distress Head: Normocephalic and  atraumatic Eyes:  sclerae anicteric, EOMI Ears: Normal auditory acuity Mouth: No deformity or lesions Neck: Supple, no masses or thyromegaly Lungs: Clear throughout to auscultation Heart: Regular rate and rhythm; no murmurs, rubs or bruits Abdomen: Soft, non tender and non distended. No masses, hepatosplenomegaly or hernias noted. Normal Bowel sounds Rectal: deferred to colonoscopy Musculoskeletal: Symmetrical with no gross deformities  Skin: No lesions on visible extremities Pulses:  Normal pulses noted Extremities: No clubbing, cyanosis, edema or deformities noted Neurological: Alert oriented x 4, grossly nonfocal Cervical Nodes:  No significant cervical adenopathy Inguinal Nodes: No significant inguinal adenopathy Psychological:  Alert and cooperative. Anxious  Assessment and Recommendations:  1. GERD, intermittent chest pain, cholelithiasis and lump in throat sensation. She may have GERD with LPR. Possible biliary colic leading to chest pain. The chest pain symptoms are not typical for reflux and biliary colic so the chest pain may be completely unrelated to any gastrointestinal disorder. She is advised to begin closely follow all standard antireflux measures. Recommended to increase pantoprazole to 40 mg daily however she prefers to wait for results of EGD. Advised ENT evaluation for further evaluation of her lump in throat sensation and she prefers to have GI evaluation prior to ENT. Attempt to obtain records from Dr. Jerl Santos. Schedule EGD. The risks (including bleeding, perforation, infection, missed lesions, medication reactions and possible hospitalization or surgery if complications occur), benefits, and alternatives to endoscopy with possible biopsy and possible dilation were discussed with the patient and they consent to proceed.  if no findings are noted on endoscopy and her symptoms do not respond to more aggressive PPI therapy we will need to consider symptomatic  cholelithiasis.  2. CRC screening, average risk. Maternal grandmother with  a history of colon cancer. Attempt to obtain records from her prior colonoscopy Schedule colonoscopy. The risks (including bleeding, perforation, infection, missed lesions, medication reactions and possible hospitalization or surgery if complications occur), benefits, and alternatives to colonoscopy with possible biopsy and possible polypectomy were discussed with the patient and they consent to proceed.   3. Chronic constipation. MiraLAX once every other day up to twice daily titrated by the patient for adequate results. Maintain a high-fiber diet with adequate daily water intake.   cc: Fay Records, MD [address] [place], [place] [address]

## 2015-12-26 NOTE — Patient Instructions (Addendum)
You have been scheduled for an endoscopy and colonoscopy. Please follow the written instructions given to you at your visit today. Please pick up your prep supplies at the pharmacy within the next 1-3 days. If you use inhalers (even only as needed), please bring them with you on the day of your procedure. Your physician has requested that you go to www.startemmi.com and enter the access code given to you at your visit today. This web site gives a general overview about your procedure. However, you should still follow specific instructions given to you by our office regarding your preparation for the procedure.  Patient advised to avoid spicy, acidic, citrus, chocolate, mints, fruit and fruit juices.  Limit the intake of caffeine, alcohol and Soda.  Don't exercise too soon after eating.  Don't lie down within 3-4 hours of eating.  Elevate the head of your bed.  Normal BMI (Body Mass Index- based on height and weight) is between 19 and 25. Your BMI today is Body mass index is 28.63 kg/(m^2). Marland Kitchen Please consider follow up  regarding your BMI with your Primary Care Provider.  Thank you for choosing me and [facility].  Pricilla Riffle. Dagoberto Ligas., MD., Marval Regal

## 2015-12-27 NOTE — Progress Notes (Signed)
Patient ID: Shannon Wright, female   DOB: 03-01-1956, 60 y.o.   MRN: [id number]  Received fax from Dr. Helyn Numbers office which according to patient is her previous Gastroenterologist stating that she is not there patient. ROI scanned into our system.

## 2016-01-15 ENCOUNTER — Telehealth: Payer: Self-pay | Admitting: Gastroenterology

## 2016-01-15 NOTE — Telephone Encounter (Signed)
Already received partial records and awaiting rest of the records from Dr. Rebekah [place] office.

## 2016-01-15 NOTE — Telephone Encounter (Signed)
Patient is calling with her maiden name to use to get the records - [name]

## 2016-01-22 ENCOUNTER — Ambulatory Visit (AMBULATORY_SURGERY_CENTER): Payer: Managed Care, Other (non HMO) | Admitting: Gastroenterology

## 2016-01-22 ENCOUNTER — Encounter: Payer: Self-pay | Admitting: Gastroenterology

## 2016-01-22 VITALS — BP 135/92 | HR 70 | Temp 99.1°F | Resp 20 | Ht 70.0 in | Wt 199.0 lb

## 2016-01-22 DIAGNOSIS — D123 Benign neoplasm of transverse colon: Secondary | ICD-10-CM

## 2016-01-22 DIAGNOSIS — K219 Gastro-esophageal reflux disease without esophagitis: Secondary | ICD-10-CM | POA: Diagnosis not present

## 2016-01-22 DIAGNOSIS — Z1211 Encounter for screening for malignant neoplasm of colon: Secondary | ICD-10-CM | POA: Diagnosis not present

## 2016-01-22 DIAGNOSIS — K317 Polyp of stomach and duodenum: Secondary | ICD-10-CM | POA: Diagnosis not present

## 2016-01-22 MED ORDER — SODIUM CHLORIDE 0.9 % IV SOLN: 500.0000 mL | INTRAVENOUS | Status: DC

## 2016-01-22 MED ORDER — PANTOPRAZOLE SODIUM 40 MG PO TBEC: 40.0000 mg | DELAYED_RELEASE_TABLET | Freq: Every day | ORAL | Status: DC

## 2016-01-22 NOTE — Progress Notes (Signed)
Called to room to assist during endoscopic procedure.  Patient ID and intended procedure confirmed with present staff. Received instructions for my participation in the procedure from the performing physician.  

## 2016-01-22 NOTE — Patient Instructions (Addendum)
YOU HAD AN ENDOSCOPIC PROCEDURE TODAY AT [facility]:   Refer to the procedure report that was given to you for any specific questions about what was found during the examination.  If the procedure report does not answer your questions, please call your gastroenterologist to clarify.  If you requested that your care partner not be given the details of your procedure findings, then the procedure report has been included in a sealed envelope for you to review at your convenience later.  YOU SHOULD EXPECT: Some feelings of bloating in the abdomen. Passage of more gas than usual.  Walking can help get rid of the air that was put into your GI tract during the procedure and reduce the bloating. If you had a lower endoscopy (such as a colonoscopy or flexible sigmoidoscopy) you may notice spotting of blood in your stool or on the toilet paper. If you underwent a bowel prep for your procedure, you may not have a normal bowel movement for a few days.  Please Note:  You might notice some irritation and congestion in your nose or some drainage.  This is from the oxygen used during your procedure.  There is no need for concern and it should clear up in a day or so.  SYMPTOMS TO REPORT IMMEDIATELY:   Following lower endoscopy (colonoscopy or flexible sigmoidoscopy):  Excessive amounts of blood in the stool  Significant tenderness or worsening of abdominal pains  Swelling of the abdomen that is new, acute  Fever of 100F or higher   Following upper endoscopy (EGD)  Vomiting of blood or coffee ground material  New chest pain or pain under the shoulder blades  Painful or persistently difficult swallowing  New shortness of breath  Fever of 100F or higher  Black, tarry-looking stools  For urgent or emergent issues, a gastroenterologist can be reached at any hour by calling [phone].   DIET: Your first meal following the procedure should be a small meal and then it is ok to progress to  your normal diet. Heavy or fried foods are harder to digest and may make you feel nauseous or bloated.  Likewise, meals heavy in dairy and vegetables can increase bloating.  Drink plenty of fluids but you should avoid alcoholic beverages for 24 hours. Please read the gerd diet. Try to increase the fiber in your diet.  ACTIVITY:  You should plan to take it easy for the rest of today and you should NOT DRIVE or use heavy machinery until tomorrow (because of the sedation medicines used during the test).    FOLLOW UP: Our staff will call the number listed on your records the next business day following your procedure to check on you and address any questions or concerns that you may have regarding the information given to you following your procedure. If we do not reach you, we will leave a message.  However, if you are feeling well and you are not experiencing any problems, there is no need to return our call.  We will assume that you have returned to your regular daily activities without incident.  If any biopsies were taken you will be contacted by phone or by letter within the next 1-3 weeks.  Please call us at [phone] if you have not heard about the biopsies in 3 weeks.    SIGNATURES/CONFIDENTIALITY: You and/or your care partner have signed paperwork which will be entered into your electronic medical record.  These signatures attest to the fact  that that the information above on your After Visit Summary has been reviewed and is understood.  Full responsibility of the confidentiality of this discharge information lies with you and/or your care-partner.  Your ENT referral will be arranged by Dr. Lynne Leader CMA on the 3rd floor.   He also wants you to keep taking your protonix  40 mg 1/2 hour before breakfast every day.   Thank-you for choosing [place] for your healthcare needs today.  Be sure to take your protonix 40mg  1/2 hour before breakfast every day.

## 2016-01-22 NOTE — Op Note (Signed)
[facility] Patient Name: Shannon Wright Procedure Date: 01/22/2016 2:56 PM MRN: [id number] Endoscopist: Ladene Artist , MD Age: 60 Date of Birth: Jul 14, 1956 Gender: Female Procedure:                Upper GI endoscopy Indications:              Gastro-esophageal reflux disease, Globus sensation Medicines:                Monitored Anesthesia Care Procedure:                Pre-Anesthesia Assessment:                           - Prior to the procedure, a History and Physical                            was performed, and patient medications and                            allergies were reviewed. The patient's tolerance of                            previous anesthesia was also reviewed. The risks                            and benefits of the procedure and the sedation                            options and risks were discussed with the patient.                            All questions were answered, and informed consent                            was obtained. Prior Anticoagulants: The patient has                            taken no previous anticoagulant or antiplatelet                            agents. ASA Grade Assessment: II - A patient with                            mild systemic disease. After reviewing the risks                            and benefits, the patient was deemed in                            satisfactory condition to undergo the procedure.                           - Prior to the procedure, a History and Physical  was performed, and patient medications and                            allergies were reviewed. The patient's tolerance of                            previous anesthesia was also reviewed. The risks                            and benefits of the procedure and the sedation                            options and risks were discussed with the patient.                            All questions were answered, and informed  consent                            was obtained. Prior Anticoagulants: The patient has                            taken no previous anticoagulant or antiplatelet                            agents. ASA Grade Assessment: II - A patient with                            mild systemic disease. After reviewing the risks                            and benefits, the patient was deemed in                            satisfactory condition to undergo the procedure.                           After obtaining informed consent, the endoscope was                            passed under direct vision. Throughout the                            procedure, the patient's blood pressure, pulse, and                            oxygen saturations were monitored continuously. The                            Model GIF-HQ190 [id number]) scope was introduced                            through the mouth, and advanced to the second part  of duodenum. The upper GI endoscopy was                            accomplished without difficulty. The patient                            tolerated the procedure well. Scope In: Scope Out: Findings:                 LA Grade A (one or more mucosal breaks less than 5                            mm, not extending between tops of 2 mucosal folds)                            esophagitis with no bleeding was found at the                            gastroesophageal junction.                           The exam of the esophagus was otherwise normal.                           A single 4 mm sessile polyp with no bleeding and no                            stigmata of recent bleeding was found in the                            gastric body. The polyp was removed with a cold                            biopsy forceps. Resection and retrieval were                            complete.                           A small hiatal hernia was present.                           The  exam of the stomach was otherwise normal.                           The duodenal bulb and second portion of the                            duodenum were normal. Complications:            No immediate complications. Estimated Blood Loss:     Estimated blood loss: none. Impression:               - LA Grade A reflux esophagitis.                           -  A single gastric polyp. Resected and retrieved.                           - Small hiatal hernia. Recommendation:           - Patient has a contact number available for                            emergencies. The signs and symptoms of potential                            delayed complications were discussed with the                            patient. Return to normal activities tomorrow.                            Written discharge instructions were provided to the                            patient.                           - Resume previous diet with standard antireflux                            measures.                           - Await pathology results.                           - Protonix (pantoprazole) 40 mg PO daily, 1 year of                            refills.                           - ENT referral                           - Office visit in 3 months                           - Continue present medications. Ladene Artist, MD 01/22/2016 3:10:20 PM This report has been signed electronically.

## 2016-01-22 NOTE — Op Note (Signed)
[facility] Patient Name: Shannon Wright Procedure Date: 01/22/2016 2:28 PM MRN: [id number] Endoscopist: Ladene Artist , MD Age: 60 Date of Birth: 02-26-1956 Gender: Female Procedure:                Colonoscopy Indications:              Screening for colorectal malignant neoplasm Medicines:                Monitored Anesthesia Care Procedure:                Pre-Anesthesia Assessment:                           - Prior to the procedure, a History and Physical                            was performed, and patient medications and                            allergies were reviewed. The patient's tolerance of                            previous anesthesia was also reviewed. The risks                            and benefits of the procedure and the sedation                            options and risks were discussed with the patient.                            All questions were answered, and informed consent                            was obtained. Prior Anticoagulants: The patient has                            taken no previous anticoagulant or antiplatelet                            agents. ASA Grade Assessment: II - A patient with                            mild systemic disease. After reviewing the risks                            and benefits, the patient was deemed in                            satisfactory condition to undergo the procedure.                           After obtaining informed consent, the colonoscope  was passed under direct vision. Throughout the                            procedure, the patient's blood pressure, pulse, and                            oxygen saturations were monitored continuously. The                            Model PCF-[id number]) scope was introduced                            through the anus and advanced to the the cecum,                            identified by appendiceal orifice and ileocecal                             valve. The colonoscopy was performed without                            difficulty. The patient tolerated the procedure                            well. The quality of the bowel preparation was                            good. The ileocecal valve, appendiceal orifice, and                            rectum were photographed. Scope In: 2:37:18 PM Scope Out: 2:53:35 PM Scope Withdrawal Time: 0 hours 10 minutes 53 seconds  Total Procedure Duration: 0 hours 16 minutes 17 seconds  Findings:                 The perianal exam findings include non-thrombosed                            external hemorrhoids.                           A 6 mm polyp was found in the transverse colon. The                            polyp was sessile. The polyp was removed with a                            cold snare. Resection and retrieval were complete.                           Internal hemorrhoids were found during                            retroflexion. The hemorrhoids were Grade I                            (  internal hemorrhoids that do not prolapse).                           The exam was otherwise normal throughout the                            examined colon. Complications:            No immediate complications. Estimated Blood Loss:     Estimated blood loss: none. Impression:               - Non-thrombosed external hemorrhoids found on                            perianal exam.                           - One 6 mm polyp in the transverse colon, removed                            with a cold snare. Resected and retrieved.                           - Internal hemorrhoids. Recommendation:           - Patient has a contact number available for                            emergencies. The signs and symptoms of potential                            delayed complications were discussed with the                            patient. Return to normal activities tomorrow.                             Written discharge instructions were provided to the                            patient.                           - Resume previous diet.                           - Continue present medications.                           - Await pathology results.                           - Repeat colonoscopy in 5 years for surveillance if                            polyp is precancerous, otherwise 10 years. Ladene Artist, MD 01/22/2016 2:57:23 PM This report has been signed electronically.

## 2016-01-22 NOTE — Progress Notes (Signed)
Report to PACU, RN, vss, BBS= Clear.  

## 2016-01-23 ENCOUNTER — Telehealth: Payer: Self-pay

## 2016-01-23 NOTE — Telephone Encounter (Signed)
Patient notified of referral to ENT Dr. Redmond Baseman.  She is scheduled for 5/18/417 1:50 arrival for 2:10 appts.

## 2016-01-23 NOTE — Telephone Encounter (Signed)
  Follow up Call-  Call back number 01/22/2016  Post procedure Call Back phone  # [phone]  Permission to leave phone message Yes    Patient was called for follow up after her procedure on 01/22/2016. No answer at the number given for follow up phone call. A message was left on the answering machine.

## 2016-01-30 ENCOUNTER — Encounter: Payer: Self-pay | Admitting: Gastroenterology

## 2016-02-01 ENCOUNTER — Other Ambulatory Visit: Payer: Self-pay | Admitting: *Deleted

## 2016-02-01 MED ORDER — AMLODIPINE BESYLATE 5 MG PO TABS: 5.0000 mg | ORAL_TABLET | Freq: Every day | ORAL | Status: AC

## 2016-08-13 ENCOUNTER — Other Ambulatory Visit: Payer: Self-pay | Admitting: Internal Medicine

## 2016-08-13 DIAGNOSIS — Z1231 Encounter for screening mammogram for malignant neoplasm of breast: Secondary | ICD-10-CM

## 2016-09-25 ENCOUNTER — Ambulatory Visit
Admission: RE | Admit: 2016-09-25 | Discharge: 2016-09-25 | Disposition: A | Discharge status: Home / Self Care | Payer: Managed Care, Other (non HMO) | Source: Ambulatory Visit | Attending: Internal Medicine | Admitting: Internal Medicine

## 2016-09-25 DIAGNOSIS — Z1231 Encounter for screening mammogram for malignant neoplasm of breast: Secondary | ICD-10-CM

## 2016-12-02 ENCOUNTER — Other Ambulatory Visit: Payer: Self-pay | Admitting: Gastroenterology

## 2017-04-08 ENCOUNTER — Other Ambulatory Visit: Payer: Self-pay | Admitting: Gastroenterology

## 2017-08-11 ENCOUNTER — Other Ambulatory Visit: Payer: Self-pay | Admitting: Internal Medicine

## 2017-08-11 DIAGNOSIS — Z1231 Encounter for screening mammogram for malignant neoplasm of breast: Secondary | ICD-10-CM

## 2017-09-30 ENCOUNTER — Ambulatory Visit
Admission: RE | Admit: 2017-09-30 | Discharge: 2017-09-30 | Disposition: A | Discharge status: Home / Self Care | Payer: BLUE CROSS/BLUE SHIELD | Source: Ambulatory Visit | Attending: Internal Medicine | Admitting: Internal Medicine

## 2017-09-30 DIAGNOSIS — Z1231 Encounter for screening mammogram for malignant neoplasm of breast: Secondary | ICD-10-CM

## 2018-06-17 ENCOUNTER — Encounter: Payer: Self-pay | Admitting: Internal Medicine

## 2018-07-06 ENCOUNTER — Encounter: Payer: Self-pay | Admitting: Internal Medicine

## 2018-07-06 ENCOUNTER — Ambulatory Visit: Payer: BLUE CROSS/BLUE SHIELD | Admitting: Internal Medicine

## 2018-07-06 VITALS — BP 110/78 | HR 82 | Ht 70.0 in | Wt 178.8 lb

## 2018-07-06 DIAGNOSIS — E782 Mixed hyperlipidemia: Secondary | ICD-10-CM

## 2018-07-06 DIAGNOSIS — I10 Essential (primary) hypertension: Secondary | ICD-10-CM

## 2018-07-06 DIAGNOSIS — R0789 Other chest pain: Secondary | ICD-10-CM

## 2018-07-06 MED ORDER — TRIAMTERENE-HCTZ 37.5-25 MG PO TABS: 0.5000 | ORAL_TABLET | Freq: Every day | ORAL | 3 refills | Status: DC

## 2018-07-06 MED ORDER — TRIAMTERENE-HCTZ 50-25 MG PO CAPS: ORAL_CAPSULE | ORAL | 1 refills | Status: AC

## 2018-07-06 NOTE — Progress Notes (Addendum)
Cardiology Office Note   Date:  07/06/2018   ID:  Shannon Wright, DOB 02/26/1956, MRN [id number]  PCP:  Dionicia Abler, MD  Cardiologist:   Dorris Carnes, MD    Patient presents for f/u of HTN and HL   History of Present Illness: Shannon Wright is a 62 y.o. female with a history of sarcoid, HL, HTN and chest pressure    CT calcium score in 2017 was 0   She has been treated with amlodipine for chest presure  SInce I saw her, she has done well from a cardiac standpoint She is dizzy at times (she says like a staggering)  No syncope    Occasional Dull HA   Not every day but often         Current Meds  Medication Sig  . acyclovir (ZOVIRAX) 400 MG tablet TAKE 1 TABLET BY MOUTH TWICE DAILY  . amLODipine (NORVASC) 5 MG tablet Take 1 tablet (5 mg total) by mouth daily.  Marland Kitchen estradiol (VIVELLE-DOT) 0.075 MG/24HR Place 1 patch onto the skin 2 (two) times a week.  . gabapentin (NEURONTIN) 300 MG capsule Take 300 mg by mouth daily.  Marland Kitchen levothyroxine (SYNTHROID, LEVOTHROID) 125 MCG tablet Take 1 tablet (125 mcg total) by mouth daily.  . pantoprazole (PROTONIX) 40 MG tablet TAKE ONE TABLET BY MOUTH ONE TIME DAILY  . triamterene-hydrochlorothiazide (DYAZIDE) 50-25 MG per capsule Take 1 capsule by mouth every morning.     Allergies:   Patient has no known allergies.   Past Medical History:  Diagnosis Date  . ANA positive   . Anxiety    chronic  . Arthritis   . Basal cell carcinoma   . Basal cell carcinoma of skin   . Cholelithiasis   . Depression   . Genital ulcer, female    question Bechets  . GERD (gastroesophageal reflux disease)   . HSV-1 infection   . Hypercalcemia   . Hyperlipidemia   . Hypertension   . Menopause   . MGUS (monoclonal gammopathy of unknown significance)   . Right knee meniscal tear   . Sarcoidosis   . Thyroid disease    MNG S/P total lthyroidectomy    Past Surgical History:  Procedure Laterality Date  . ABDOMINAL HYSTERECTOMY  2005  .  BILATERAL SALPINGOOPHORECTOMY  2005  . BREAST EXCISIONAL BIOPSY    . TOTAL THYROIDECTOMY  04/2010     Social History:  The patient  reports that she quit smoking about 13 years ago. She has a 2.50 pack-year smoking history. She has never used smokeless tobacco. She reports that she does not drink alcohol or use drugs.   Family History:  The patient's family history includes Arthritis in her mother; Cancer in her maternal grandfather, maternal grandmother, and mother; Hypertension in her mother; Osteoporosis in her mother; Other in her mother and sister.    ROS:  Please see the history of present illness. All other systems are reviewed and  Negative to the above problem except as noted.    PHYSICAL EXAM: VS:  BP 110/78   Pulse 82   Ht 5\' 10"  (1.778 m)   Wt 178 lb 12.8 oz (81.1 kg)   BMI 25.66 kg/m   GEN: Well nourished, well developed, in no acute distress  HEENT: normal  Neck: no JVD, carotid bruits, or masses Cardiac: RRR; no murmurs, rubs, or gallops,no edema  Respiratory:  clear to auscultation bilaterally, normal work of breathing GI: soft, nontender, nondistended, + BS  No  hepatomegaly  MS: no deformity Moving all extremities   Skin: warm and dry, no rash Neuro:  Strength and sensation are intact Psych: euthymic mood, full affect   EKG:  EKG is ordered today.  SR 83 bpm     Lipid Panel    Component Value Date/Time   CHOL 284 (H) 10/10/2014 1055   TRIG 114 10/10/2014 1055   HDL 64 10/10/2014 1055   CHOLHDL 4.4 10/10/2014 1055   VLDL 23 10/10/2014 1055   LDLCALC 197 (H) 10/10/2014 1055      Wt Readings from Last 3 Encounters:  07/06/18 178 lb 12.8 oz (81.1 kg)  01/22/16 199 lb (90.3 kg)  12/26/15 199 lb 8 oz (90.5 kg)      ASSESSMENT AND PLAN:  1  Dizziness    Orthostatic BP / P   :  Laying   108/82  P 78;  Sitting 110/82  P 84   Standing:  106/80   P 98    Standing 4 min  116/86   P 104 I have asked the pt to cut back on maxzide to  1/2 tab per day   Stay  hydrated   Will f/u in Januar  2   HTN   As aboe     3  HL    Last lipids LDL 264  HDL 62  LDL 187  Trig 174     Disccussed diet   CT calcium score 0    Would not push beyond  COnsdier repeat calcium score in a couple years    4  CP   Denies     Current medicines are reviewed at length with the patient today.  The patient does not have concerns regarding medicines.  Signed, Dorris Carnes, MD  07/06/2018 2:28 PM    [facility] [address], [place], [place]  [address] Phone: [phone]; Fax: [phone]

## 2018-07-06 NOTE — Patient Instructions (Signed)
Medication Instructions:  Your physician has recommended you make the following change in your medication:  1.) stop diazide 2.) start triamterene/hctz 37.5/25--take HALF TABLET ONCE A DAY STAY HYDRATED!   If you need a refill on your cardiac medications before your next appointment, please call your pharmacy.   Lab work: none If you have labs (blood work) drawn today and your tests are completely normal, you will receive your results only by: Marland Kitchen MyChart Message (if you have MyChart) OR . A paper copy in the mail If you have any lab test that is abnormal or we need to change your treatment, we will call you to review the results.  Testing/Procedures: none  Follow-Up: At [facility], you and your health needs are our priority.  As part of our continuing mission to provide you with exceptional heart care, we have created designated Provider Care Teams.  These Care Teams include your primary Cardiologist (physician) and Advanced Practice Providers (APPs -  Physician Assistants and Nurse Practitioners) who all work together to provide you with the care you need, when you need it. You will need a follow up appointment in:  3 months.  Please call our office 2 months in advance to schedule this appointment.  You may see Dorris Carnes, MD or one of the following Advanced Practice Providers on your designated Care Team: Richardson Dopp, PA-C [place], [place] . Daune Perch, NP  Any Other Special Instructions Will Be Listed Below (If Applicable). none

## 2018-08-02 ENCOUNTER — Ambulatory Visit: Payer: BLUE CROSS/BLUE SHIELD | Admitting: Internal Medicine

## 2018-08-23 ENCOUNTER — Other Ambulatory Visit: Payer: Self-pay | Admitting: Internal Medicine

## 2018-08-23 DIAGNOSIS — Z1231 Encounter for screening mammogram for malignant neoplasm of breast: Secondary | ICD-10-CM

## 2018-10-13 ENCOUNTER — Ambulatory Visit
Admission: RE | Admit: 2018-10-13 | Discharge: 2018-10-13 | Disposition: A | Discharge status: Home / Self Care | Payer: BLUE CROSS/BLUE SHIELD | Source: Ambulatory Visit | Attending: Internal Medicine | Admitting: Internal Medicine

## 2018-10-13 DIAGNOSIS — Z1231 Encounter for screening mammogram for malignant neoplasm of breast: Secondary | ICD-10-CM

## 2018-10-18 ENCOUNTER — Ambulatory Visit: Payer: BLUE CROSS/BLUE SHIELD | Admitting: Internal Medicine

## 2018-12-28 ENCOUNTER — Telehealth: Payer: Self-pay

## 2018-12-28 NOTE — Telephone Encounter (Signed)
Called pt after noticing that she had been removed from Dr. Harrington Challenger' 4/13 schedule in an attempt to set up a virtual visit.   Pt declined any virtual visit type at this time as she prefers to only see Dr. Harrington Challenger in office in person.   Pt was rescheduled for a September appt.      Primary Cardiologist:  Dorris Carnes, MD   Patient contacted.  History reviewed.  No symptoms to suggest any unstable cardiac conditions.  Based on discussion, with current pandemic situation, we will be postponing this appointment for [facility] with a plan for f/u in greater than 12 wks or sooner if feasible/necessary.  If symptoms change, she has been instructed to contact our office.    Pt has already been rescheduled. Will not route to cancel pool.   Shannon Flavin, RN  12/28/2018 12:38 PM         .

## 2018-12-28 NOTE — Telephone Encounter (Signed)
Need to set up virtual visit for pt and move to another day on Dr. Harrington Challenger' schedule.   LMTCB

## 2019-01-03 ENCOUNTER — Ambulatory Visit: Payer: BLUE CROSS/BLUE SHIELD | Admitting: Internal Medicine

## 2019-06-02 ENCOUNTER — Ambulatory Visit: Payer: Self-pay | Admitting: Internal Medicine

## 2019-09-14 ENCOUNTER — Other Ambulatory Visit (HOSPITAL_BASED_OUTPATIENT_CLINIC_OR_DEPARTMENT_OTHER): Payer: Self-pay | Admitting: *Deleted

## 2019-09-14 ENCOUNTER — Other Ambulatory Visit (HOSPITAL_BASED_OUTPATIENT_CLINIC_OR_DEPARTMENT_OTHER): Payer: Self-pay | Admitting: Internal Medicine

## 2019-09-14 DIAGNOSIS — Z1231 Encounter for screening mammogram for malignant neoplasm of breast: Secondary | ICD-10-CM

## 2019-10-14 ENCOUNTER — Ambulatory Visit (HOSPITAL_BASED_OUTPATIENT_CLINIC_OR_DEPARTMENT_OTHER)
Admission: RE | Admit: 2019-10-14 | Discharge: 2019-10-14 | Disposition: A | Discharge status: Home / Self Care | Payer: BC Managed Care – PPO | Source: Ambulatory Visit | Attending: Internal Medicine | Admitting: Internal Medicine

## 2019-10-14 ENCOUNTER — Other Ambulatory Visit: Payer: Self-pay

## 2019-10-14 DIAGNOSIS — Z1231 Encounter for screening mammogram for malignant neoplasm of breast: Secondary | ICD-10-CM | POA: Diagnosis present

## 2019-10-26 ENCOUNTER — Ambulatory Visit (HOSPITAL_BASED_OUTPATIENT_CLINIC_OR_DEPARTMENT_OTHER): Payer: Self-pay

## 2021-04-28 IMAGING — MG DIGITAL SCREENING BILAT W/ TOMO W/ CAD
8 series · 9 of 24 positions shown · non-contrast
Comparison: Previous exam(s).

CLINICAL DATA: Screening.

EXAM:
DIGITAL SCREENING BILATERAL MAMMOGRAM WITH TOMO AND CAD

[L CC synth-2D]
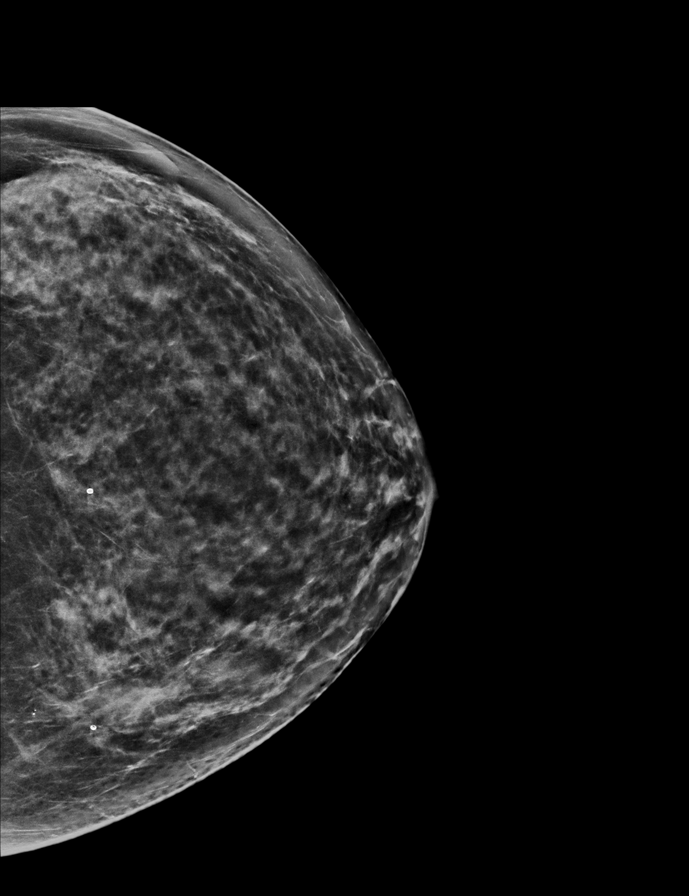

[R MLO synth-2D]
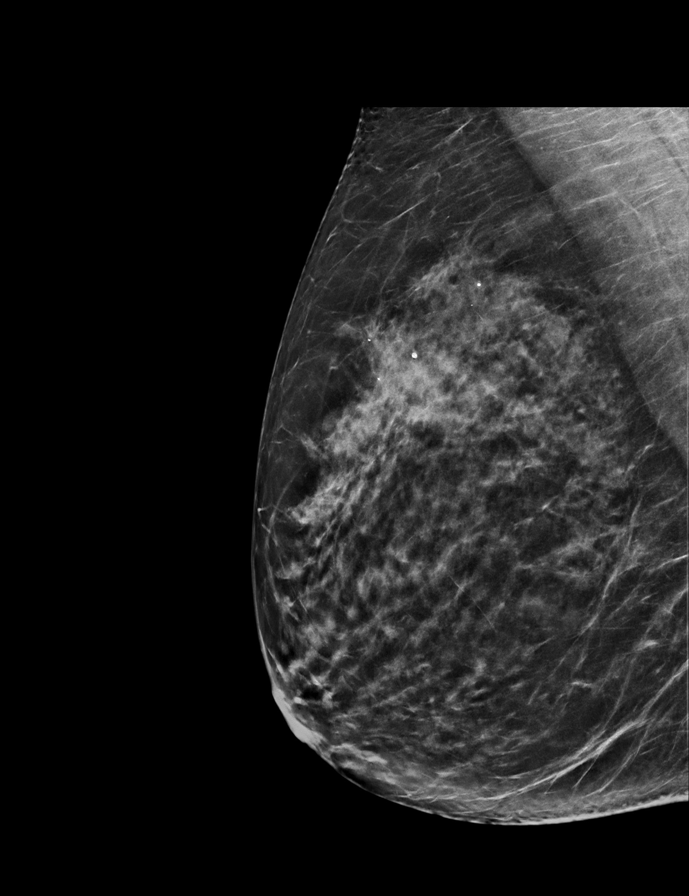

[L MLO synth-2D]
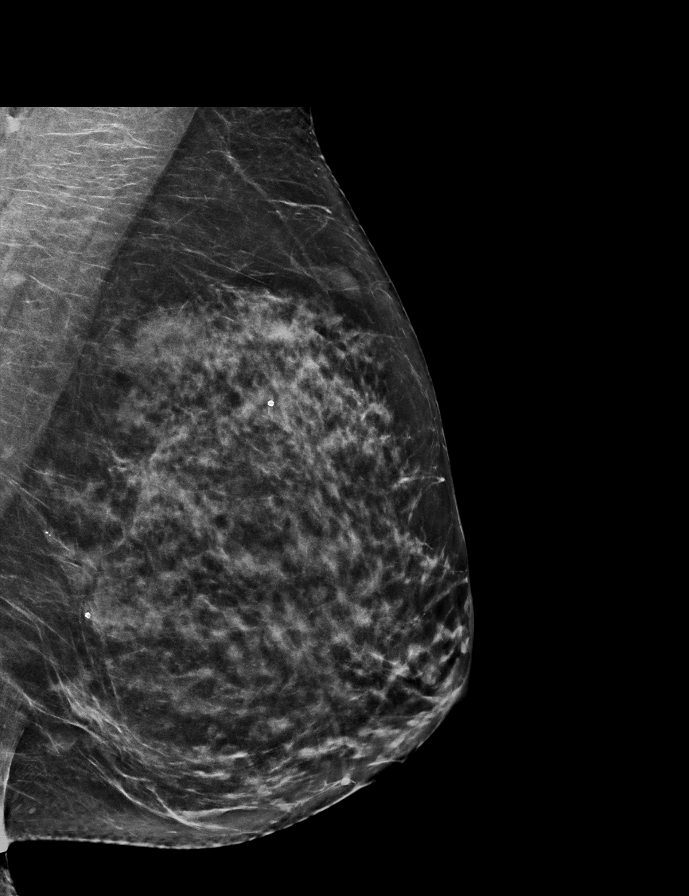

[R CC synth-2D]
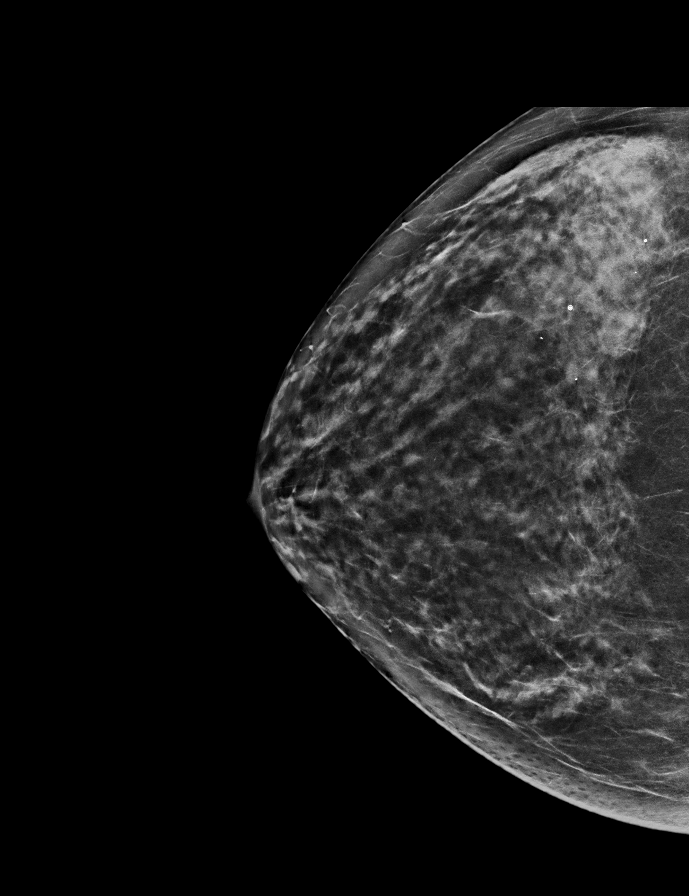

[L MLO tomo · 2 of 69 frames shown]
[frame 23/69]
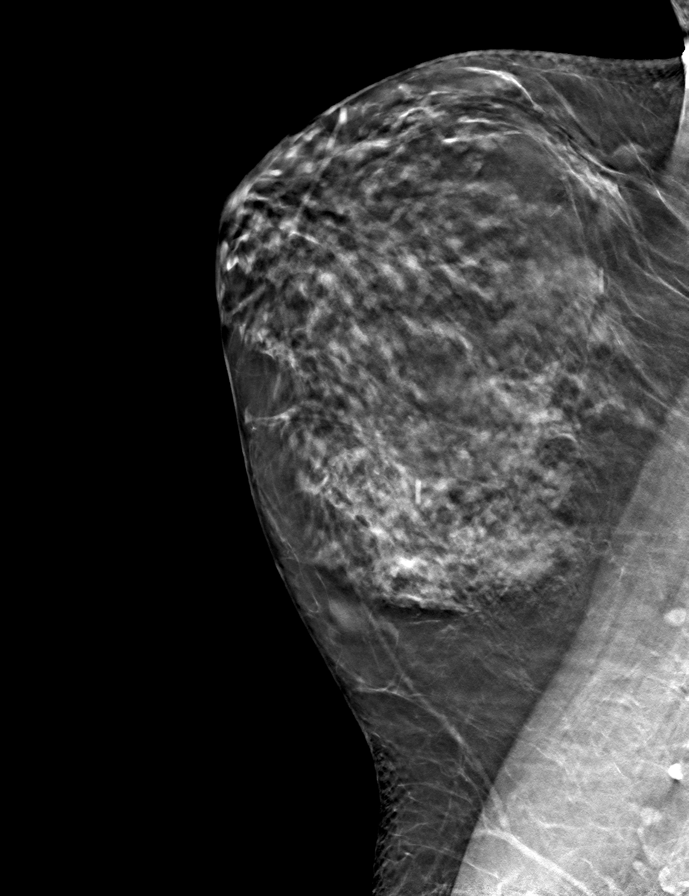
[frame 35/69]
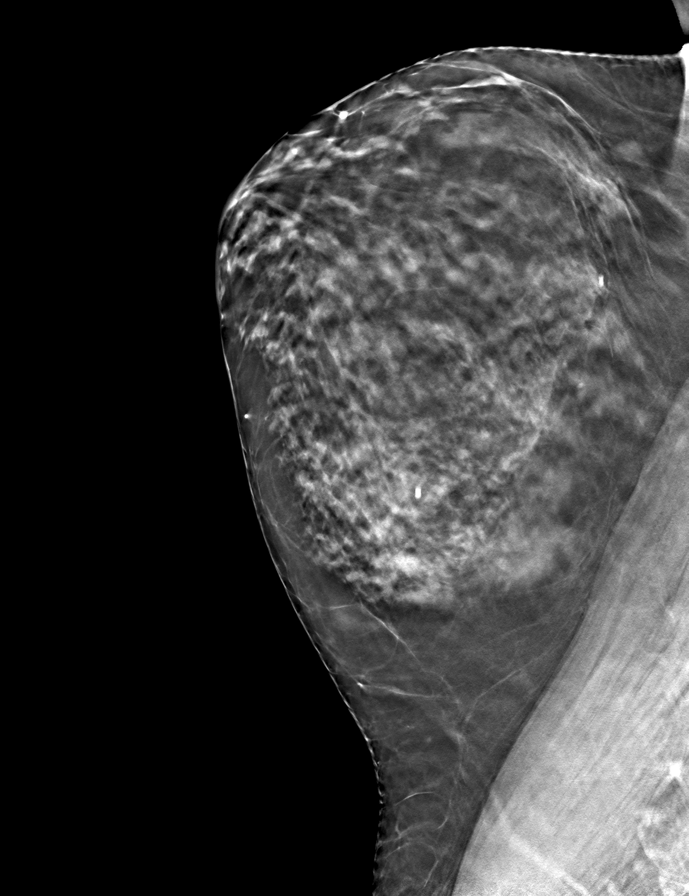

[R MLO tomo · tomo slice 35/68.0]
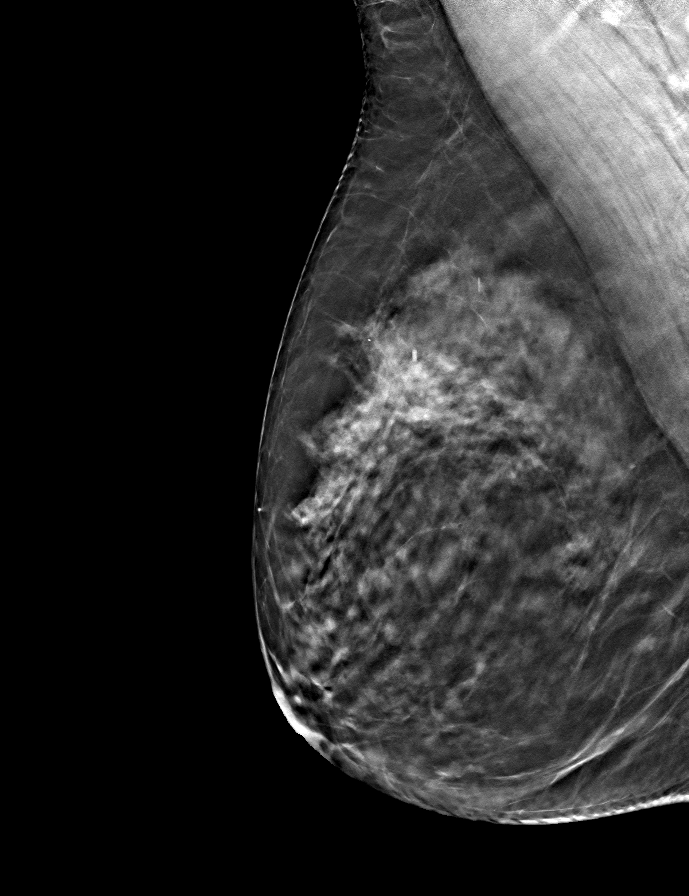

[R CC tomo · tomo slice 33/66.0]
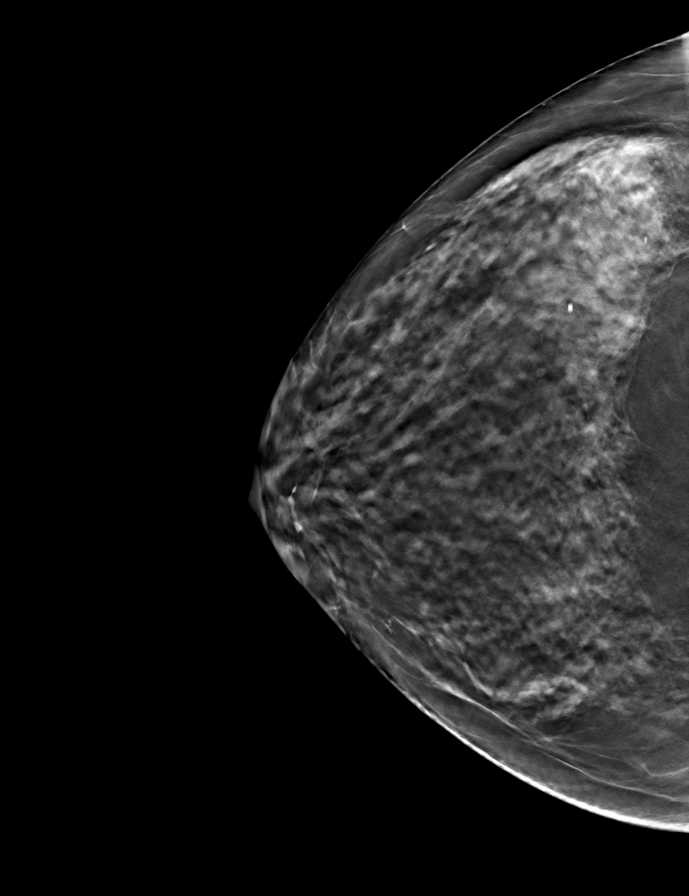

[L CC tomo · tomo slice 33/66.0]
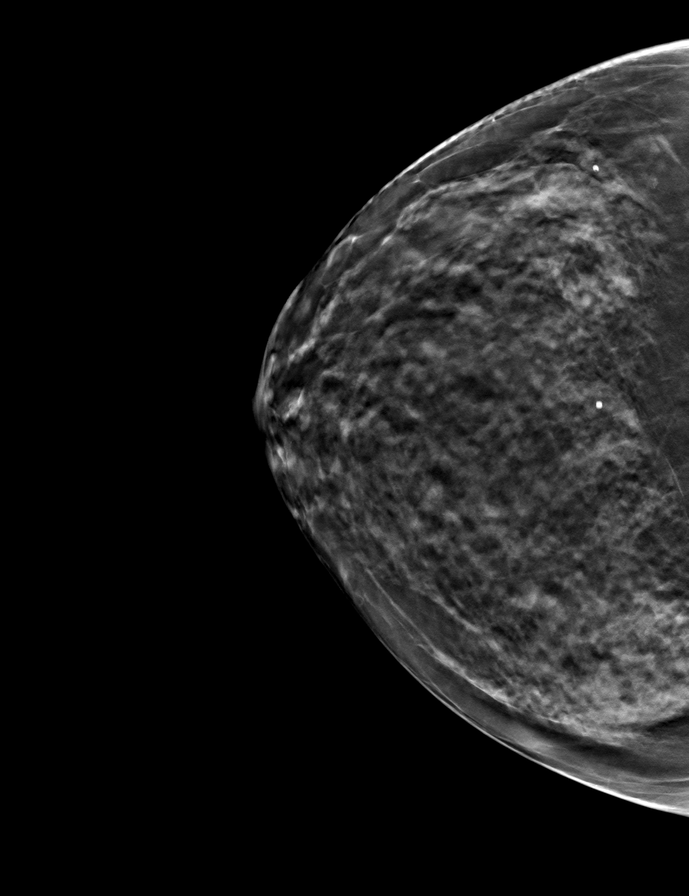

[9 of 24 positions shown; findings below may reference images not displayed]

ACR Breast Density Category c: The breast tissue is heterogeneously
dense, which may obscure small masses.
FINDINGS: There are no findings suspicious for malignancy. Images were
processed with CAD.
IMPRESSION: No mammographic evidence of malignancy. A result letter of this
screening mammogram will be mailed directly to the patient.

RECOMMENDATION:
Screening mammogram in one year. (Code:[id number])

BI-RADS CATEGORY  1: Negative.

## 2021-05-30 ENCOUNTER — Encounter: Payer: Self-pay | Admitting: Gastroenterology
